# Patient Record
Sex: Male | Born: 1970 | Race: White | Hispanic: No | Smoking: Never smoker
Health system: Southern US, Community
[De-identification: ages and names within clinical notes are randomized; demographics above are authoritative.]

## PROBLEM LIST (undated history)

## (undated) HISTORY — PX: HERNIA REPAIR: SHX51

## (undated) HISTORY — PX: APPENDECTOMY: SHX54

## (undated) HISTORY — PX: VASECTOMY: SHX75

---

## 2000-02-15 ENCOUNTER — Ambulatory Visit (HOSPITAL_BASED_OUTPATIENT_CLINIC_OR_DEPARTMENT_OTHER): Admission: RE | Admit: 2000-02-15 | Discharge: 2000-02-15 | Payer: Self-pay | Admitting: Surgery

## 2008-08-04 ENCOUNTER — Observation Stay (HOSPITAL_COMMUNITY): Admission: EM | Admit: 2008-08-04 | Discharge: 2008-08-05 | Payer: Self-pay | Admitting: Emergency Medicine

## 2008-08-04 ENCOUNTER — Encounter (INDEPENDENT_AMBULATORY_CARE_PROVIDER_SITE_OTHER): Payer: Self-pay | Admitting: General Surgery

## 2009-08-17 ENCOUNTER — Encounter: Admission: RE | Admit: 2009-08-17 | Discharge: 2009-08-17 | Payer: Self-pay | Admitting: General Surgery

## 2009-09-21 ENCOUNTER — Ambulatory Visit (HOSPITAL_BASED_OUTPATIENT_CLINIC_OR_DEPARTMENT_OTHER): Admission: RE | Admit: 2009-09-21 | Discharge: 2009-09-21 | Payer: Self-pay | Admitting: General Surgery

## 2011-02-08 LAB — CBC
Hemoglobin: 14.6 g/dL (ref 13.0–17.0)
MCHC: 35.5 g/dL (ref 30.0–36.0)
RBC: 4.8 MIL/uL (ref 4.22–5.81)
RDW: 13 % (ref 11.5–15.5)

## 2011-02-08 LAB — BASIC METABOLIC PANEL
BUN: 11 mg/dL (ref 6–23)
GFR calc Af Amer: >60 mL/min (ref >60)
GFR calc non Af Amer: >60 mL/min (ref >60)

## 2011-02-08 LAB — DIFFERENTIAL
Basophils Relative: 1 % (ref 0–1)
Eosinophils Relative: 3 % (ref 0–5)
Lymphocytes Relative: 35 % (ref 12–46)
Lymphs Abs: 1.9 10*3/uL (ref 0.7–4.0)
Monocytes Absolute: 0.5 10*3/uL (ref 0.1–1.0)

## 2011-03-21 NOTE — Op Note (Signed)
NAMEJEREMIAS, BROYHILL                  ACCOUNT NO.:  [id number]   MEDICAL RECORD NO.:  [id number]          PATIENT TYPE:  INP   LOCATION:  5127                         FACILITY:  [facility]   PHYSICIAN:  Ollen Gross. Vernell Morgans, M.D. DATE OF BIRTH:  [birth year]   DATE OF PROCEDURE:  08/04/2008  DATE OF DISCHARGE:                               OPERATIVE REPORT   PREOPERATIVE DIAGNOSIS:  Acute appendicitis.   POSTOPERATIVE DIAGNOSIS:  Acute appendicitis.   PROCEDURE:  Laparoscopic appendectomy.   SURGEON:  Ollen Gross. Vernell Morgans, MD   ANESTHESIA:  General endotracheal.   PROCEDURE:  After informed consent was obtained, the patient was brought  to the operating room, placed in a supine position on the operating room  table.  After adequate induction of general anesthesia, the patient's  abdomen was prepped with Betadine and draped in usual sterile manner.  An area below the umbilicus was infiltrated with 0.25% Marcaine.  Small  incision was made with a 15-blade knife.  This incision was carried down  through the subcutaneous tissue bluntly with a hemostat and [employer]  retractors until linea alba was identified.  The linea alba was incised  with 15-blade knife and each side was grasped with Kocher clamps and  elevated anteriorly.  The preperitoneal space was then probed bluntly  with an hemostat until the peritoneum was opened and access was gained  to the abdominal cavity.  A 0 Vicryl purse-string stitch was placed in  the fascia around the opening.  Hasson cannula was placed through the  opening and anchored in place to the previous 0 Vicryl pursestring  stitch.  The abdomen was then insufflated with carbon dioxide without  difficulty.  The patient was placed in Trendelenburg position, rotated  with the right side up.  Next, the suprapubic area was infiltrated with  0.25% Marcaine.  A small incision was made with 15-blade knife and a  10/11 mm port was placed bluntly through this incision into the  abdominal cavity under direct vision.  A site was then chosen in the  right upper quadrant for placement of 5-mm port.  This area was  infiltrated with 0.25% Marcaine.  A small stab incision was made with 15-  blade knife and a 5-mm port was placed bluntly through this incision  into the abdominal cavity under direct vision.  The laparoscope was then  moved to the suprapubic port.  We were also able to use a 30-degree  scope.  Using the [facility] grasper and harmonic scalpel, the right lower  quadrant was inspected.  The appendix was readily identified.  The  appendix was very long, but inflamed.  We were able to elevate the  appendix anteriorly and the mesoappendix was then taken down sharply  with the harmonic scalpel.  Once this was accomplished, the base of the  appendix at its junction with the cecum was identified and cleared of  any tissue.  A laparoscopic 45-mm blue load six row stapler was then  placed through the Hasson cannula across the base of the appendix at its  junction with the  cecum clamped and fired thereby dividing the base of  the appendix between the staple lines.  A laparoscopic bag was inserted  through the Hasson cannula.  The appendix was placed in the bag and the  bag was sealed.  The staple line was then inspected, and there was a  small bleeding area along the staple line and this was controlled with a  clip applier.  Once this was accomplished, the area was completely dry.  The area was then irrigated with copious amounts of saline till the  effluent was clear.  The appendix and bag were removed through the  infraumbilical port with Hasson cannula without difficulty.  The fascial  defect was closed with a previous placed Vicryl purse-string stitch as  well as with another figure-of-eight 0 Vicryl stitch.  The rest of the  ports were removed under direct vision and were found to be hemostatic.  The gas was allowed to escape.  The skin incisions were all closed  with  interrupted 4-0 Monocryl  subcuticular stitches and Dermabond dressings were applied.  The patient  tolerated the procedure well.  At the end of the case, all needle,  sponge, and instrument counts were correct.  The patient was then  awakened and taken to recovery in stable condition.      Ollen Gross. Vernell Morgans, M.D.  Electronically Signed     PST/MEDQ  D:  08/04/2008  T:  08/05/2008  Job:  [id number]

## 2011-03-21 NOTE — H&P (Signed)
NAMEDONIVIN, WIRT                  ACCOUNT NO.:  [id number]   MEDICAL RECORD NO.:  [id number]          PATIENT TYPE:  INP   LOCATION:  5127                         FACILITY:  [facility]   PHYSICIAN:  Ollen Gross. Vernell Morgans, M.D. DATE OF BIRTH:  10/20/1971   DATE OF ADMISSION:  08/04/2008  DATE OF DISCHARGE:                              HISTORY & PHYSICAL   [name] is a 40 year old white male who presents with right lower  quadrant pain that started this morning.  The pain has gotten worse  throughout the day.  The pain has been associated with fever at home as  well as some nausea and vomiting.  He denies any chest pain or shortness  of breath.  No diarrhea or dysuria.   His other review of systems is unremarkable.   His past medical history is none.   Past surgical history is significant for I and D of a pilonidal cyst and  ORIF of the wrist.   Medications are none.   ALLERGIES:  None.   SOCIAL HISTORY:  He denies use of tobacco or tobacco products.   FAMILY HISTORY:  Noncontributory.   PHYSICAL EXAMINATION:  VITAL SIGNS:  His temp is 98.6, pulse 85, blood  pressure 126/75.  GENERAL:  He is a well-developed, well-nourished, white male in no acute  distress.  SKIN:  Warm and dry.  No jaundice.  HEENT:  Eyes, extraocular movements are intact.  Pupils are equal and  reactive to light.  Sclerae are nonicteric.  LUNGS:  Clear bilaterally with no use of accessory respiratory muscles.  HEART:  Regular rate and rhythm with an impulse in the left chest.  ABDOMEN:  Soft with moderate tenderness in the right lower quadrant.  No  peritonitis or guarding.  EXTREMITIES:  No cyanosis, clubbing, or edema.  Good strength in arms  and legs.  PSYCHOLOGIC:  He is alert and oriented x3 with no evidence of anxiety or  depression.   On review of his lab work, it was significant for white count of 15,000.  On review of his CT scan, it did show an enlarged inflamed appendix.   ASSESSMENT AND PLAN:   This is a 40 year old white male with what appears  to be acute appendicitis.  Because of the risks of perforation of the  sepsis, I believe he would benefit from having his appendix removed  tonight.  He  would also like to have this done.  I have explained him in detail the  risk and benefits of the operation to remove the appendix as well as  some of the technical aspects, and he understands and wishes to proceed.  We will obtain some routine lab work in preparation of doing this for  him tonight.      Ollen Gross. Vernell Morgans, M.D.  Electronically Signed     PST/MEDQ  D:  08/04/2008  T:  08/05/2008  Job:  [id number]

## 2011-08-07 LAB — CBC
Hemoglobin: 14.1
MCHC: 34
MCV: 87.3
Platelets: 236
RBC: 4.75
RDW: 13.2
WBC: 15 — ABNORMAL HIGH

## 2011-08-07 LAB — COMPREHENSIVE METABOLIC PANEL
AST: 29
Albumin: 4.1
Alkaline Phosphatase: 77
Calcium: 9.4
GFR calc Af Amer: >60
Glucose, Bld: 109 — ABNORMAL HIGH
Total Protein: 7

## 2011-08-07 LAB — DIFFERENTIAL
Basophils Relative: 0
Eosinophils Relative: 0
Lymphocytes Relative: 5 — ABNORMAL LOW
Lymphs Abs: 0.8
Monocytes Relative: 5
Neutro Abs: 13.4 — ABNORMAL HIGH

## 2011-08-07 LAB — URINALYSIS, ROUTINE W REFLEX MICROSCOPIC
Bilirubin Urine: NEGATIVE
Glucose, UA: NEGATIVE
Hgb urine dipstick: NEGATIVE
Ketones, ur: NEGATIVE
Specific Gravity, Urine: 1.027
Urobilinogen, UA: 0.2
pH: 7.5

## 2012-11-06 HISTORY — PX: CARDIAC CATHETERIZATION: SHX172

## 2012-11-15 ENCOUNTER — Other Ambulatory Visit: Payer: Self-pay | Admitting: Cardiology

## 2012-11-18 ENCOUNTER — Other Ambulatory Visit: Payer: Self-pay | Admitting: Cardiology

## 2012-11-18 ENCOUNTER — Encounter: Payer: Self-pay | Admitting: Cardiology

## 2012-11-18 NOTE — H&P (Signed)
Office Visit     Patient: Jacob Sutton, Jacob Sutton Provider: Silvestre Mines, MD  DOB: 09/11/1971 Age: 41 Y Sex: Male Date: 11/14/2012  Phone: [phone]   Address: [address], [place], [place]  Pcp: TRACY L THOMAS       Subjective:     CC:    1. TT/Stress test f/u.        HPI:  General:  The patient presents today for followup of chest pain. He says that he has been having chest pain intermittently for over a year. He says it is a dull ache midsternal that usually comes on with exertion. He has noticed in the last few months that if he over exerts himself his chest feels tight and the pain is much worse. If he exerts himself the pain will go up into his left shoulder and left neck. He does get SOB with the pain but denies any diaphoresis or nausea. He has a very strong family history of CAD as well. He underwent nuclear stress test which showed mild ischemia in the anterior wall with normal LVF. His EKG was normal and he had no chest pain on the treadmill. He has not had much CP recently because he says that he has not been exerting himself on purpose so he does not get chest pain. He is also concerned that he has much more exertional fatigue than he had in the past..        ROS:  See HPI, A twelve system review was perfomed at today's visit. For pertinent positives and negatives see HPI.       Medical History: Bilateral inguinal hernia repair 2010.        Surgical History: appendectomy per [place] surg 10/09, vasectomy 10/2007, hernia surgery 2010.        Family History: Father: alive CAD diagnosed late 40's, 3 stents, bypass sx Mother: alive CAD @ 60, 1 stent Paternal Grand Mother: CAD Maternal Grand Mother: CAD 1 sister(s) - healthy.        Social History:  General:  History of smoking  cigarettes: Never smoked no Smoking.  no Tobacco Exposure.  Alcohol: yes, occasionally.  Caffeine: yes, tea, coffee.  no Recreational drug use.  Occupation: employed, welding.    Marital Status: married.  Children: Boys, 1, girls, 1.  Seat belt use: yes.        Medications: None       Allergies: N.Sutton.D.A.      Objective:     Vitals: Wt 228, Wt change 1.4 lb, Ht 72, BMI 30.92, Pulse sitting 82, BP sitting 126/86.       Examination:        Assessment:     Assessment:  1. Chest pain - 786.50 (Primary)  2. Abnormal cardiovascular stress test - 794.39    Plan:     1. Chest pain  LAB: Basic Metabolic Normal    GLUCOSE 83 70-99 - mg/dL    BUN 15 6-26 - mg/dL    CREATININE 0.91 0.60-1.30 - mg/dl    eGFR (NON-AFRICAN AMERICAN) 92 >60 - calc    eGFR (AFRICAN AMERICAN) 111 >60 - calc    SODIUM 140 136-145 - mmol/L    POTASSIUM 4.5 3.5-5.5 - mmol/L    CHLORIDE 104 98-107 - mmol/L    C02 29 22-32 - mmol/L    ANION GAP 10.8 6.0-20.0 - mmol/L    CALCIUM 9.8 8.6-10.3 - mg/dL     Corson,Danielle 11/14/2012 02:50:02 PM >For Cath. Sending   to Dr. Tineka Uriegas to [facility]. Alexiah Koroma M 11/14/2012 02:50:53 PM >    LAB: CBC with Diff Normal    WBC 5.3 4.0-11.0 - Sutton/ul    RBC 5.09 4.20-5.80 - M/uL    HGB 14.6 13.0-17.0 - g/dL    HCT 43.2 39.0-52.0 - %    MCH 28.6 27.0-33.0 - pg    MPV 7.9 7.5-10.7 - fL    MCV 84.8 80.0-94.0 - fL    MCHC 33.8 32.0-36.0 - g/dL    RDW 13.2 11.5-15.5 - %    NRBC# 0.00 -    PLT 229 150-400 - Sutton/uL    NEUT % 51.7 43.3-71.9 - %    NRBC% 0.10 - %    LYMPH% 34.8 16.8-43.5 - %    MONO % 10.1 4.6-12.4 - %    EOS % 2.8 0.0-7.8 - %    BASO % 0.6 0.0-1.0 - %    NEUT # 2.8 1.9-7.2 - Sutton/uL    LYMPH# 1.90 1.10-2.70 - Sutton/uL    MONO # 0.5 0.3-0.8 - Sutton/uL    EOS # 0.1 0.0-0.6 - Sutton/uL    BASO # 0.0 0.0-0.1 - Sutton/uL     Alfio Loescher M 11/14/2012 02:47:56 PM > Corson,Danielle 11/14/2012 02:50:45 PM > For Cath    LAB: PT (Prothrombin Time) (005199) Abnormal    Prothrombin Time 10.6 9.1-12.0 - SEC    INR 1.0 0.8-1.2 -     Keddrick Wyne M 11/15/2012 09:12:27 AM > Corson,Danielle 11/15/2012 11:35:48 AM > FOR CATH    I have reviewed the findings  of the stress test with him. I am concerned given his symptoms and family history as well as abnormal nuclear stress test that he has underlying CAD and have reocmmended proceeding cardiac cath. , Risks and benefits of cardiac catheterization have been reviewed including risk of stroke, heart attack, death, bleeding, renal impariment and arterial damage. There was ample oppurtuny to answer questions. Alternatives were discussed. Patient understands and wishes to proceed.        Procedures:  Venipuncture:  Venipuncture: Smith,Michele 11/14/2012 04:02:12 PM > , performed in right arm.        Immunizations:        Labs:        Procedure Codes: 80048 ECL BMP, 85025 ECL CBC PLATELET DIFF, 36415 BLOOD COLLECTION ROUTINE VENIPUNCTURE       Preventive:         Follow Up: cath      Provider: Jamol Ginyard, MD  Patient: Jacob Sutton, Jacob Sutton DOB: 12/22/1970 Date: 11/14/2012    

## 2012-11-19 ENCOUNTER — Encounter (HOSPITAL_BASED_OUTPATIENT_CLINIC_OR_DEPARTMENT_OTHER)
Admission: RE | Disposition: A | Discharge status: Home / Self Care | Payer: Self-pay | Source: Ambulatory Visit | Attending: Cardiology

## 2012-11-19 ENCOUNTER — Inpatient Hospital Stay (HOSPITAL_BASED_OUTPATIENT_CLINIC_OR_DEPARTMENT_OTHER)
Admission: RE | Admit: 2012-11-19 | Discharge: 2012-11-19 | Disposition: A | Discharge status: Home / Self Care | Payer: BC Managed Care – PPO | Source: Ambulatory Visit | Attending: Cardiology | Admitting: Cardiology

## 2012-11-19 DIAGNOSIS — R0789 Other chest pain: Secondary | ICD-10-CM | POA: Insufficient documentation

## 2012-11-19 DIAGNOSIS — R0602 Shortness of breath: Secondary | ICD-10-CM

## 2012-11-19 DIAGNOSIS — R9439 Abnormal result of other cardiovascular function study: Secondary | ICD-10-CM

## 2012-11-19 DIAGNOSIS — R079 Chest pain, unspecified: Secondary | ICD-10-CM

## 2012-11-19 SURGERY — JV LEFT HEART CATHETERIZATION WITH CORONARY ANGIOGRAM
Anesthesia: Moderate Sedation

## 2012-11-19 MED ORDER — SODIUM CHLORIDE 0.9 % IJ SOLN: 3.0000 mL | Freq: Two times a day (BID) | INTRAMUSCULAR | Status: DC

## 2012-11-19 MED ORDER — ONDANSETRON HCL 4 MG/2ML IJ SOLN: 4.0000 mg | Freq: Four times a day (QID) | INTRAMUSCULAR | Status: DC | PRN

## 2012-11-19 MED ORDER — ACETAMINOPHEN 325 MG PO TABS: 650.0000 mg | ORAL_TABLET | ORAL | Status: DC | PRN

## 2012-11-19 MED ORDER — SODIUM CHLORIDE 0.9 % IV SOLN
INTRAVENOUS | Status: DC
  Administered 2012-11-19: 09:00:00 via INTRAVENOUS

## 2012-11-19 MED ORDER — DIAZEPAM 5 MG PO TABS
5.0000 mg | ORAL_TABLET | ORAL | Status: AC
  Administered 2012-11-19: 5 mg via ORAL

## 2012-11-19 MED ORDER — ASPIRIN 81 MG PO CHEW
324.0000 mg | CHEWABLE_TABLET | ORAL | Status: AC
  Administered 2012-11-19: 324 mg via ORAL

## 2012-11-19 MED ORDER — SODIUM CHLORIDE 0.9 % IJ SOLN: 3.0000 mL | INTRAMUSCULAR | Status: DC | PRN

## 2012-11-19 MED ORDER — SODIUM CHLORIDE 0.9 % IV SOLN: 250.0000 mL | INTRAVENOUS | Status: DC | PRN

## 2012-11-19 MED ORDER — SODIUM CHLORIDE 0.9 % IV SOLN: 1.0000 mL/kg/h | INTRAVENOUS | Status: DC

## 2012-11-19 NOTE — H&P (View-Only) (Signed)
Office Visit     Patient: Filter, Isiaah K Provider: Wilberto Console, MD  DOB: 01/25/1971 Age: 42 Y Sex: Male Date: 11/14/2012  Phone: [phone]   Address: [address], [place], [place]  Pcp: TRACY L THOMAS       Subjective:     CC:    1. TT/Stress test f/u.        HPI:  General:  The patient presents today for followup of chest pain. He says that he has been having chest pain intermittently for over a year. He says it is a dull ache midsternal that usually comes on with exertion. He has noticed in the last few months that if he over exerts himself his chest feels tight and the pain is much worse. If he exerts himself the pain will go up into his left shoulder and left neck. He does get SOB with the pain but denies any diaphoresis or nausea. He has a very strong family history of CAD as well. He underwent nuclear stress test which showed mild ischemia in the anterior wall with normal LVF. His EKG was normal and he had no chest pain on the treadmill. He has not had much CP recently because he says that he has not been exerting himself on purpose so he does not get chest pain. He is also concerned that he has much more exertional fatigue than he had in the past..        ROS:  See HPI, A twelve system review was perfomed at today's visit. For pertinent positives and negatives see HPI.       Medical History: Bilateral inguinal hernia repair 2010.        Surgical History: appendectomy per [place] surg 10/09, vasectomy 10/2007, hernia surgery 2010.        Family History: Father: alive CAD diagnosed late 40's, 3 stents, bypass sx Mother: alive CAD @ 60, 1 stent Paternal Grand Mother: CAD Maternal Grand Mother: CAD 1 sister(s) - healthy.        Social History:  General:  History of smoking  cigarettes: Never smoked no Smoking.  no Tobacco Exposure.  Alcohol: yes, occasionally.  Caffeine: yes, tea, coffee.  no Recreational drug use.  Occupation: employed, welding.    Marital Status: married.  Children: Boys, 1, girls, 1.  Seat belt use: yes.        Medications: None       Allergies: N.K.D.A.      Objective:     Vitals: Wt 228, Wt change 1.4 lb, Ht 72, BMI 30.92, Pulse sitting 82, BP sitting 126/86.       Examination:        Assessment:     Assessment:  1. Chest pain - 786.50 (Primary)  2. Abnormal cardiovascular stress test - 794.39    Plan:     1. Chest pain  LAB: Basic Metabolic Normal    GLUCOSE 83 70-99 - mg/dL    BUN 15 6-26 - mg/dL    CREATININE 0.91 0.60-1.30 - mg/dl    eGFR (NON-AFRICAN AMERICAN) 92 >60 - calc    eGFR (AFRICAN AMERICAN) 111 >60 - calc    SODIUM 140 136-145 - mmol/L    POTASSIUM 4.5 3.5-5.5 - mmol/L    CHLORIDE 104 98-107 - mmol/L    C02 29 22-32 - mmol/L    ANION GAP 10.8 6.0-20.0 - mmol/L    CALCIUM 9.8 8.6-10.3 - mg/dL     Corson,Danielle 11/14/2012 02:50:02 PM >For Cath. Sending   to Dr. Ulani Degrasse to [facility]. Nikos Anglemyer M 11/14/2012 02:50:53 PM >    LAB: CBC with Diff Normal    WBC 5.3 4.0-11.0 - K/ul    RBC 5.09 4.20-5.80 - M/uL    HGB 14.6 13.0-17.0 - g/dL    HCT 43.2 39.0-52.0 - %    MCH 28.6 27.0-33.0 - pg    MPV 7.9 7.5-10.7 - fL    MCV 84.8 80.0-94.0 - fL    MCHC 33.8 32.0-36.0 - g/dL    RDW 13.2 11.5-15.5 - %    NRBC# 0.00 -    PLT 229 150-400 - K/uL    NEUT % 51.7 43.3-71.9 - %    NRBC% 0.10 - %    LYMPH% 34.8 16.8-43.5 - %    MONO % 10.1 4.6-12.4 - %    EOS % 2.8 0.0-7.8 - %    BASO % 0.6 0.0-1.0 - %    NEUT # 2.8 1.9-7.2 - K/uL    LYMPH# 1.90 1.10-2.70 - K/uL    MONO # 0.5 0.3-0.8 - K/uL    EOS # 0.1 0.0-0.6 - K/uL    BASO # 0.0 0.0-0.1 - K/uL     Vanecia Limpert M 11/14/2012 02:47:56 PM > Corson,Danielle 11/14/2012 02:50:45 PM > For Cath    LAB: PT (Prothrombin Time) (005199) Abnormal    Prothrombin Time 10.6 9.1-12.0 - SEC    INR 1.0 0.8-1.2 -     Kennette Cuthrell M 11/15/2012 09:12:27 AM > Corson,Danielle 11/15/2012 11:35:48 AM > FOR CATH    I have reviewed the findings  of the stress test with him. I am concerned given his symptoms and family history as well as abnormal nuclear stress test that he has underlying CAD and have reocmmended proceeding cardiac cath. , Risks and benefits of cardiac catheterization have been reviewed including risk of stroke, heart attack, death, bleeding, renal impariment and arterial damage. There was ample oppurtuny to answer questions. Alternatives were discussed. Patient understands and wishes to proceed.        Procedures:  Venipuncture:  Venipuncture: Smith,Michele 11/14/2012 04:02:12 PM > , performed in right arm.        Immunizations:        Labs:        Procedure Codes: 80048 ECL BMP, 85025 ECL CBC PLATELET DIFF, 36415 BLOOD COLLECTION ROUTINE VENIPUNCTURE       Preventive:         Follow Up: cath      Provider: Genelda Roark, MD  Patient: Aldous, Jhalil K DOB: 01/21/1971 Date: 11/14/2012    

## 2012-11-19 NOTE — CV Procedure (Signed)
PROCEDURE:  Left heart catheterization with selective coronary angiography, left ventriculogram.  INDICATIONS:  Chest pain and abnormal nuclear stress test  The risks, benefits, and details of the procedure were explained to the patient.  The patient verbalized understanding and wanted to proceed.  Informed written consent was obtained.  PROCEDURE TECHNIQUE:  After Xylocaine anesthesia a 20F sheath was placed in the right femoral artery with a single anterior needle wall stick.   Left coronary angiography was done using a Judkins L4 guide catheter.  Right coronary angiography was done using a Judkins R4 guide catheter.  Left ventriculography was done using a pigtail catheter.    CONTRAST:  Total of 65 cc.  COMPLICATIONS:  None.    HEMODYNAMICS:  Aortic pressure was 112/9mmHg; LV pressure was 110/30mmhg; LVEDP .  There was no gradient between the left ventricle and aorta.    ANGIOGRAPHIC DATA:   The left main coronary artery is widely patent and bifurcates into a LAD and left circumflex arteries.  The left anterior descending artery is widely patent and gives rise to a high  first diagonal which is widely patent.  It then gives rise to a second and third diagonal branches which are patent.  The ongoing LAD is patent and traverses to the apex.  The left circumflex artery is widely patent. It gives rise to a small first OM which is patent.  It then gives rise to a moderate sized OM2 which is patent.  The left circ traverses the AV groove and is widely patent, distally gives rise to a 3rd OM which is widely patent.    The right coronary artery is widely patent.  It gives rise to a moderate sized acute RV marginal branch which is patent.  Distally it bifurcates into a PDA and PL which are patent.    LEFT VENTRICULOGRAM:  Left ventricular angiogram was done in the 30 RAO projection and revealed normal left ventricular wall motion and systolic function with an estimated ejection fraction of  55%.  LVEDP was 12 mmHg.  IMPRESSIONS:  1. Normal left main coronary artery. 2. Normal left anterior descending artery and its branches. 3. Normal left circumflex artery and its branches. 4. Normal right coronary artery. 5. Normal left ventricular systolic function.  LVEDP 12 mmHg.  Ejection fraction 55%.  RECOMMENDATION:   1.  D/C home after IVF and bedrest complete. 2.  Workup of noncardiac chest pain and SOB per primary MD 3.  Followup in my office in 2 weeks for groin check

## 2012-11-19 NOTE — Interval H&P Note (Signed)
History and Physical Interval Note:  11/19/2012 9:36 AM  Jacob Sutton  has presented today for surgery, with the diagnosis of cp  The various methods of treatment have been discussed with the patient and family. After consideration of risks, benefits and other options for treatment, the patient has consented to  Procedure(s) (LRB) with comments: JV LEFT HEART CATHETERIZATION WITH CORONARY ANGIOGRAM (N/A) as a surgical intervention .  The patient's history has been reviewed, patient examined, no change in status, stable for surgery.  I have reviewed the patient's chart and labs.  Questions were answered to the patient's satisfaction.     Alverda Nazzaro R

## 2012-11-19 NOTE — OR Nursing (Signed)
Tegaderm dressing applied, site level 0, bedrest begins at 1020 

## 2013-02-26 ENCOUNTER — Ambulatory Visit (INDEPENDENT_AMBULATORY_CARE_PROVIDER_SITE_OTHER): Payer: BC Managed Care – PPO | Admitting: Sports Medicine

## 2013-02-26 ENCOUNTER — Emergency Department (INDEPENDENT_AMBULATORY_CARE_PROVIDER_SITE_OTHER): Payer: BC Managed Care – PPO

## 2013-02-26 ENCOUNTER — Emergency Department
Admission: EM | Admit: 2013-02-26 | Discharge: 2013-02-26 | Disposition: A | Discharge status: Home / Self Care | Payer: BC Managed Care – PPO | Source: Home / Self Care | Attending: Family Medicine | Admitting: Family Medicine

## 2013-02-26 DIAGNOSIS — S62309A Unspecified fracture of unspecified metacarpal bone, initial encounter for closed fracture: Secondary | ICD-10-CM

## 2013-02-26 DIAGNOSIS — W19XXXA Unspecified fall, initial encounter: Secondary | ICD-10-CM

## 2013-02-26 DIAGNOSIS — S62306A Unspecified fracture of fifth metacarpal bone, right hand, initial encounter for closed fracture: Secondary | ICD-10-CM | POA: Insufficient documentation

## 2013-02-26 DIAGNOSIS — S62308A Unspecified fracture of other metacarpal bone, initial encounter for closed fracture: Secondary | ICD-10-CM

## 2013-02-26 DIAGNOSIS — S62339A Displaced fracture of neck of unspecified metacarpal bone, initial encounter for closed fracture: Secondary | ICD-10-CM

## 2013-02-26 MED ORDER — PERMETHRIN 5 % EX CREA: TOPICAL_CREAM | Freq: Once | CUTANEOUS | Status: DC

## 2013-02-26 MED ORDER — HYDROCODONE-ACETAMINOPHEN 5-325 MG PO TABS: 1.0000 | ORAL_TABLET | Freq: Three times a day (TID) | ORAL | Status: DC | PRN
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
## 2013-02-27 ENCOUNTER — Institutional Professional Consult (permissible substitution): Payer: BC Managed Care – PPO | Admitting: Sports Medicine

## 2013-03-05 ENCOUNTER — Ambulatory Visit (INDEPENDENT_AMBULATORY_CARE_PROVIDER_SITE_OTHER): Payer: BC Managed Care – PPO | Admitting: Sports Medicine

## 2013-03-05 ENCOUNTER — Other Ambulatory Visit: Payer: Self-pay | Admitting: Sports Medicine

## 2013-03-05 ENCOUNTER — Ambulatory Visit: Payer: Self-pay

## 2013-03-05 ENCOUNTER — Encounter: Payer: Self-pay | Admitting: Sports Medicine

## 2013-03-05 ENCOUNTER — Ambulatory Visit (INDEPENDENT_AMBULATORY_CARE_PROVIDER_SITE_OTHER): Payer: BC Managed Care – PPO

## 2013-03-05 VITALS — BP 123/88 | HR 81

## 2013-03-05 DIAGNOSIS — T148XXA Other injury of unspecified body region, initial encounter: Secondary | ICD-10-CM

## 2013-03-05 DIAGNOSIS — S62309A Unspecified fracture of unspecified metacarpal bone, initial encounter for closed fracture: Secondary | ICD-10-CM

## 2013-03-05 DIAGNOSIS — IMO0001 Reserved for inherently not codable concepts without codable children: Secondary | ICD-10-CM

## 2013-03-05 DIAGNOSIS — S62306A Unspecified fracture of fifth metacarpal bone, right hand, initial encounter for closed fracture: Secondary | ICD-10-CM

## 2013-03-05 NOTE — Assessment & Plan Note (Signed)
Doing well 2 weeks status post nondisplaced, minimally angulated fracture of the neck of the fifth metacarpal bone of the right hand. He still has some swelling something to keep him in a splint. No further pain management needed. Return in 2 weeks, x-ray before visit.
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
## 2013-03-19 ENCOUNTER — Ambulatory Visit (INDEPENDENT_AMBULATORY_CARE_PROVIDER_SITE_OTHER): Payer: BC Managed Care – PPO | Admitting: Sports Medicine

## 2013-03-19 ENCOUNTER — Encounter: Payer: Self-pay | Admitting: Sports Medicine

## 2013-03-19 ENCOUNTER — Ambulatory Visit (INDEPENDENT_AMBULATORY_CARE_PROVIDER_SITE_OTHER): Payer: BC Managed Care – PPO

## 2013-03-19 VITALS — BP 117/74 | HR 74 | Wt 207.0 lb

## 2013-03-19 DIAGNOSIS — Z09 Encounter for follow-up examination after completed treatment for conditions other than malignant neoplasm: Secondary | ICD-10-CM

## 2013-03-19 DIAGNOSIS — Z0289 Encounter for other administrative examinations: Secondary | ICD-10-CM

## 2013-03-19 DIAGNOSIS — S62309A Unspecified fracture of unspecified metacarpal bone, initial encounter for closed fracture: Secondary | ICD-10-CM

## 2013-03-19 DIAGNOSIS — S62306A Unspecified fracture of fifth metacarpal bone, right hand, initial encounter for closed fracture: Secondary | ICD-10-CM

## 2013-03-19 NOTE — Assessment & Plan Note (Addendum)
Continue splint, return in 3 weeks.  Disability form and charge sheet filled out

## 2013-03-19 NOTE — Progress Notes (Signed)
  Subjective: 3 and half weeks status post right fifth metacarpal neck fracture. Doing well in ulnar gutter splint. Pain is minimal.   Objective: General: Well-developed, well-nourished, and in no acute distress. Splint is removed, there is only minimal swelling, he is quite tender over the fracture site. Neurovascularly intact distally.  X-rays reviewed, there is no change in position or alignment, he does have copious bony callus formation.  Assessment/plan:

## 2013-04-04 ENCOUNTER — Encounter: Payer: Self-pay | Admitting: Sports Medicine

## 2013-04-04 ENCOUNTER — Ambulatory Visit (INDEPENDENT_AMBULATORY_CARE_PROVIDER_SITE_OTHER): Payer: BC Managed Care – PPO | Admitting: Sports Medicine

## 2013-04-04 VITALS — BP 104/73 | HR 67 | Wt 210.0 lb

## 2013-04-04 DIAGNOSIS — Z0000 Encounter for general adult medical examination without abnormal findings: Secondary | ICD-10-CM | POA: Insufficient documentation

## 2013-04-04 DIAGNOSIS — S62306D Unspecified fracture of fifth metacarpal bone, right hand, subsequent encounter for fracture with routine healing: Secondary | ICD-10-CM

## 2013-04-04 DIAGNOSIS — Z299 Encounter for prophylactic measures, unspecified: Secondary | ICD-10-CM

## 2013-04-04 NOTE — Progress Notes (Signed)
  Subjective: 6-1/2 weeks out from right fifth metacarpal fracture, healed, no pain.   Objective: General: Well-developed, well-nourished, and in no acute distress. Hand is unremarkable, no pain over the fracture site, excellent motion, excellent strength.  Assessment/plan:

## 2013-04-04 NOTE — Assessment & Plan Note (Signed)
He will come back for a complete physical. I am going to go ahead and order some routine blood work.

## 2013-04-04 NOTE — Assessment & Plan Note (Signed)
Healed

## 2013-04-08 LAB — CBC
HCT: 41.1 % (ref 39.0–52.0)
Hemoglobin: 14.4 g/dL (ref 13.0–17.0)
MCH: 29 pg (ref 26.0–34.0)
MCHC: 35 g/dL (ref 30.0–36.0)
MCV: 82.7 fL (ref 78.0–100.0)
Platelets: 206 K/uL (ref 150–400)
RBC: 4.97 MIL/uL (ref 4.22–5.81)
RDW: 14.6 % (ref 11.5–15.5)
WBC: 5.3 K/uL (ref 4.0–10.5)

## 2013-04-08 LAB — HEMOGLOBIN A1C
Hgb A1c MFr Bld: 5.3 % (ref <5.7)
Mean Plasma Glucose: 105 mg/dL (ref <117)

## 2013-04-09 LAB — COMPREHENSIVE METABOLIC PANEL
BUN: 14 mg/dL (ref 6–23)
CO2: 25 mEq/L (ref 19–32)
Creat: 0.94 mg/dL (ref 0.50–1.35)
Glucose, Bld: 81 mg/dL (ref 70–99)
Sodium: 140 mEq/L (ref 135–145)
Total Bilirubin: 0.6 mg/dL (ref 0.3–1.2)
Total Protein: 6.7 g/dL (ref 6.0–8.3)

## 2013-04-09 LAB — TESTOSTERONE, FREE, TOTAL, SHBG
Sex Hormone Binding: 26 nmol/L (ref 13–71)
Testosterone, Free: 77.1 pg/mL (ref 47.0–244.0)
Testosterone-% Free: 2.3 % (ref 1.6–2.9)
Testosterone: 339 ng/dL (ref 300–890)

## 2013-04-09 LAB — TSH: TSH: 1.81 u[IU]/mL (ref 0.350–4.500)

## 2013-04-09 LAB — VITAMIN D 25 HYDROXY (VIT D DEFICIENCY, FRACTURES): Vit D, 25-Hydroxy: 50 ng/mL (ref 30–89)

## 2013-04-09 LAB — LIPID PANEL
Cholesterol: 173 mg/dL (ref 0–200)
HDL: 43 mg/dL (ref >39)
LDL Cholesterol: 112 mg/dL — ABNORMAL HIGH (ref 0–99)
Total CHOL/HDL Ratio: 4 ratio
Triglycerides: 90 mg/dL (ref <150)
VLDL: 18 mg/dL (ref 0–40)

## 2013-04-09 LAB — COMPREHENSIVE METABOLIC PANEL WITH GFR
ALT: 18 U/L (ref 0–53)
AST: 19 U/L (ref 0–37)
Albumin: 4 g/dL (ref 3.5–5.2)
Alkaline Phosphatase: 76 U/L (ref 39–117)
Calcium: 9.5 mg/dL (ref 8.4–10.5)
Chloride: 104 meq/L (ref 96–112)
Potassium: 4.1 meq/L (ref 3.5–5.3)

## 2013-04-14 ENCOUNTER — Encounter: Payer: Self-pay | Admitting: Sports Medicine

## 2013-04-14 ENCOUNTER — Ambulatory Visit (INDEPENDENT_AMBULATORY_CARE_PROVIDER_SITE_OTHER): Payer: BC Managed Care – PPO | Admitting: Sports Medicine

## 2013-04-14 VITALS — BP 104/68 | HR 62 | Wt 207.0 lb

## 2013-04-14 DIAGNOSIS — Z299 Encounter for prophylactic measures, unspecified: Secondary | ICD-10-CM

## 2013-04-14 DIAGNOSIS — H6120 Impacted cerumen, unspecified ear: Secondary | ICD-10-CM

## 2013-04-14 DIAGNOSIS — Z0000 Encounter for general adult medical examination without abnormal findings: Secondary | ICD-10-CM

## 2013-04-14 DIAGNOSIS — H6123 Impacted cerumen, bilateral: Secondary | ICD-10-CM

## 2013-04-14 NOTE — Assessment & Plan Note (Signed)
With mild hearing loss. This resolved after I curetted both sides.

## 2013-04-14 NOTE — Progress Notes (Signed)
  Subjective:    CC: CPE  HPI:  This pleasant 42 year old male really has no complaints. His only concern is difficulty achieving long lasting erections during intercourse, during masturbation he is however able to maintain a full erection.  His labs came back fairly perfect.  He did have a cardiac catheterization in January of this year that was clean.  Past medical history, Surgical history, Family history not pertinant except as noted below, Social history, Allergies, and medications have been entered into the medical record, reviewed, and no changes needed.   Review of Systems: No headache, visual changes, nausea, vomiting, diarrhea, constipation, dizziness, abdominal pain, skin rash, fevers, chills, night sweats, swollen lymph nodes, weight loss, chest pain, body aches, joint swelling, muscle aches, shortness of breath, mood changes, visual or auditory hallucinations.  Objective:    General: Well Developed, well nourished, and in no acute distress.  Neuro: Alert and oriented x3, extra-ocular muscles intact, sensation grossly intact.  HEENT: Normocephalic, atraumatic, pupils equal round reactive to light, neck supple, no masses, no lymphadenopathy, thyroid nonpalpable. Oropharynx, nasopharynx unremarkable, there is bilateral cerumen impaction, it was removed by me with a curette.  Skin: Warm and dry, no rashes noted.  Cardiac: Regular rate and rhythm, no murmurs rubs or gallops.  Respiratory: Clear to auscultation bilaterally. Not using accessory muscles, speaking in full sentences.  Abdominal: Soft, nontender, nondistended, positive bowel sounds, no masses, no organomegaly.  Musculoskeletal: Shoulder, elbow, wrist, hip, knee, ankle stable, and with full range of motion.  Impression and Recommendations:    The patient was counselled, risk factors were discussed, anticipatory guidance given.

## 2013-04-14 NOTE — Assessment & Plan Note (Signed)
Complete physical performed today. Return in one year.

## 2013-09-11 ENCOUNTER — Other Ambulatory Visit: Payer: Self-pay

## 2013-11-18 ENCOUNTER — Encounter: Payer: Self-pay | Admitting: Emergency Medicine

## 2013-11-18 ENCOUNTER — Emergency Department
Admission: EM | Admit: 2013-11-18 | Discharge: 2013-11-18 | Disposition: A | Discharge status: Home / Self Care | Payer: BC Managed Care – PPO | Source: Home / Self Care | Attending: Emergency Medicine | Admitting: Emergency Medicine

## 2013-11-18 DIAGNOSIS — J209 Acute bronchitis, unspecified: Secondary | ICD-10-CM

## 2013-11-18 MED ORDER — AZITHROMYCIN 250 MG PO TABS
ORAL_TABLET | ORAL | Status: DC
Start: 2013-11-18 — End: 2014-04-17

## 2013-11-18 MED ORDER — PROMETHAZINE-CODEINE 6.25-10 MG/5ML PO SYRP: ORAL_SOLUTION | ORAL | Status: DC

## 2013-11-18 NOTE — ED Notes (Signed)
Brain c/o cough, congestion and HA x 5 days. He had body aches and fever the first 2 days. No flu vac this season.

## 2013-11-18 NOTE — ED Provider Notes (Signed)
CSN: [id number]     Arrival date & time 11/18/13  0808 History   First MD Initiated Contact with Patient 11/18/13 0830     Chief Complaint  Patient presents with  . Cough  . Nasal Congestion   (Consider location/radiation/quality/duration/timing/severity/associated sxs/prior Treatment) HPI URI HISTORY  Jacob Sutton is a 43 y.o. male who complains of onset of cold symptoms for several days.  Have been using over-the-counter treatment which helps a little bit.  No chills/sweats +  Fever  +  Nasal congestion +  Discolored Post-nasal drainage No sinus pain/pressure No sore throat  +  Severe, hacking cough, especially at night. Had some wheezing a few days ago but that resolved Mild chest congestion No hemoptysis No shortness of breath No pleuritic pain  No itchy/red eyes No earache  No nausea No vomiting No abdominal pain No diarrhea  No skin rashes +  Fatigue No myalgias No headache   Reviewed past medical history of negative workup for asthma and negative cardiac/catheterization workup last year.  History reviewed. No pertinent past medical history. Past Surgical History  Procedure Laterality Date  . Appendectomy    . Vasectomy    . Hernia repair    . Cardiac catheterization  11/06/12    Clean.   Family History  Problem Relation Age of Onset  . Heart disease Mother   . Heart disease Father   . Diabetes Father    History  Substance Use Topics  . Smoking status: Never Smoker   . Smokeless tobacco: Never Used  . Alcohol Use: Yes    Review of Systems  All other systems reviewed and are negative.    Allergies  Review of patient's allergies indicates no known allergies.  Home Medications   Current Outpatient Rx  Name  Route  Sig  Dispense  Refill  . azithromycin (ZITHROMAX Z-PAK) 250 MG tablet      Take 2 tablets on day one, then 1 tablet daily on days 2 through 5   1 each   0   . promethazine-codeine (PHENERGAN WITH CODEINE) 6.25-10 MG/5ML syrup       Take 1-2 teaspoons every 4-6 hours as needed for cough. May cause drowsiness.   120 mL   0    BP 124/79  Pulse 83  Temp(Src) 98.5 F (36.9 C) (Oral)  Resp 14  Wt 197 lb (89.359 kg)  SpO2 99% Physical Exam  Nursing note and vitals reviewed. Constitutional: He is oriented to person, place, and time. He appears well-developed and well-nourished. No distress.  HENT:  Head: Normocephalic and atraumatic.  Right Ear: Tympanic membrane normal.  Left Ear: Tympanic membrane normal.  Nose: Nose normal.  Mouth/Throat: Oropharynx is clear and moist. No oropharyngeal exudate.  Eyes: Right eye exhibits no discharge. Left eye exhibits no discharge. No scleral icterus.  Neck: Neck supple.  Cardiovascular: Normal rate, regular rhythm and normal heart sounds.   Pulmonary/Chest: No respiratory distress. He has no wheezes. He has rhonchi. He has no rales.  Lymphadenopathy:    He has no cervical adenopathy.  Neurological: He is alert and oriented to person, place, and time.  Skin: Skin is warm and dry.    ED Course  Procedures (including critical care time) Labs Review Labs Reviewed - No data to display Imaging Review No results found.  EKG Interpretation    Date/Time:    Ventricular Rate:    PR Interval:    QRS Duration:   QT Interval:    QTC Calculation:  R Axis:     Text Interpretation:              MDM   1. Acute bronchitis    Treatment options discussed, as well as risks, benefits, alternatives. Patient voiced understanding and agreement with the following plans:  Zithromax Z-Pak Phenergan With Codeine, as needed for severe cough We discussed other treatment options, such as IM Depo-Medrol or by mouth prednisone burst for the inflammatory cough, but he declined those options at this time. Other otc symptomatic care discussed. Follow-up with your primary care doctor in 5-7 days if not improving, or sooner if symptoms become worse. Precautions discussed. Red  flags discussed. Questions invited and answered. Patient voiced understanding and agreement.  Also, I advised he get a flu shot after this acute bronchitis has resolved.     Jacqulyn Cane, MD 11/18/13 [phone]

## 2014-04-17 ENCOUNTER — Encounter: Payer: Self-pay | Admitting: Sports Medicine

## 2014-04-17 ENCOUNTER — Ambulatory Visit (INDEPENDENT_AMBULATORY_CARE_PROVIDER_SITE_OTHER): Payer: BC Managed Care – PPO | Admitting: Sports Medicine

## 2014-04-17 DIAGNOSIS — Z0000 Encounter for general adult medical examination without abnormal findings: Secondary | ICD-10-CM

## 2014-04-17 DIAGNOSIS — Z299 Encounter for prophylactic measures, unspecified: Secondary | ICD-10-CM

## 2014-04-17 NOTE — Assessment & Plan Note (Signed)
Healthy male, complete physical performed. Checking routine blood work. Return in a year.

## 2014-04-17 NOTE — Progress Notes (Signed)
  Subjective:    CC: Complete physical exam  HPI:  This is a very pleasant 43 year-old male, presents for complete physical, he is healthy, and has absolutely no complaints.  Past medical history, Surgical history, Family history not pertinant except as noted below, Social history, Allergies, and medications have been entered into the medical record, reviewed, and no changes needed.   Review of Systems: No headache, visual changes, nausea, vomiting, diarrhea, constipation, dizziness, abdominal pain, skin rash, fevers, chills, night sweats, swollen lymph nodes, weight loss, chest pain, body aches, joint swelling, muscle aches, shortness of breath, mood changes, visual or auditory hallucinations.  Objective:    General: Well Developed, well nourished, and in no acute distress.  Neuro: Alert and oriented x3, extra-ocular muscles intact, sensation grossly intact.  HEENT: Normocephalic, atraumatic, pupils equal round reactive to light, neck supple, no masses, no lymphadenopathy, thyroid nonpalpable. Oropharynx, nasopharynx, external ear canals are unremarkable. Skin: Warm and dry, no rashes noted.  Cardiac: Regular rate and rhythm, no murmurs rubs or gallops.  Respiratory: Clear to auscultation bilaterally. Not using accessory muscles, speaking in full sentences.  Abdominal: Soft, nontender, nondistended, positive bowel sounds, no masses, no organomegaly.  Musculoskeletal: Shoulder, elbow, wrist, hip, knee, ankle stable, and with full range of motion.  Impression and Recommendations:    The patient was counselled, risk factors were discussed, anticipatory guidance given.

## 2014-04-21 LAB — COMPREHENSIVE METABOLIC PANEL
ALT: 12 U/L (ref 0–53)
Alkaline Phosphatase: 64 U/L (ref 39–117)
CO2: 25 mEq/L (ref 19–32)
Sodium: 138 mEq/L (ref 135–145)
Total Bilirubin: 0.5 mg/dL (ref 0.2–1.2)
Total Protein: 7 g/dL (ref 6.0–8.3)

## 2014-04-21 LAB — COMPREHENSIVE METABOLIC PANEL WITH GFR
AST: 16 U/L (ref 0–37)
Albumin: 4.4 g/dL (ref 3.5–5.2)
BUN: 19 mg/dL (ref 6–23)
Calcium: 9.4 mg/dL (ref 8.4–10.5)
Chloride: 103 meq/L (ref 96–112)
Creat: 0.89 mg/dL (ref 0.50–1.35)
Glucose, Bld: 88 mg/dL (ref 70–99)
Potassium: 4.5 meq/L (ref 3.5–5.3)

## 2014-04-21 LAB — CBC
HCT: 42.7 % (ref 39.0–52.0)
Hemoglobin: 14.7 g/dL (ref 13.0–17.0)
MCH: 29.1 pg (ref 26.0–34.0)
MCHC: 34.4 g/dL (ref 30.0–36.0)
MCV: 84.4 fL (ref 78.0–100.0)
Platelets: 221 10*3/uL (ref 150–400)
RBC: 5.06 MIL/uL (ref 4.22–5.81)
RDW: 14.7 % (ref 11.5–15.5)
WBC: 5 K/uL (ref 4.0–10.5)

## 2014-04-21 LAB — TSH: TSH: 2.049 u[IU]/mL (ref 0.350–4.500)

## 2014-04-21 LAB — LIPID PANEL
Cholesterol: 193 mg/dL (ref 0–200)
HDL: 48 mg/dL (ref >39)
LDL Cholesterol: 134 mg/dL — ABNORMAL HIGH (ref 0–99)
Total CHOL/HDL Ratio: 4 Ratio
Triglycerides: 56 mg/dL (ref <150)
VLDL: 11 mg/dL (ref 0–40)

## 2014-04-21 LAB — HEMOGLOBIN A1C
Hgb A1c MFr Bld: 5.7 % — ABNORMAL HIGH (ref <5.7)
Mean Plasma Glucose: 117 mg/dL — ABNORMAL HIGH (ref <117)

## 2014-07-27 ENCOUNTER — Ambulatory Visit (INDEPENDENT_AMBULATORY_CARE_PROVIDER_SITE_OTHER): Payer: BC Managed Care – PPO | Admitting: Sports Medicine

## 2014-07-27 ENCOUNTER — Encounter: Payer: Self-pay | Admitting: Sports Medicine

## 2014-07-27 ENCOUNTER — Telehealth: Payer: Self-pay

## 2014-07-27 VITALS — BP 126/81 | HR 60 | Ht 72.0 in | Wt 203.0 lb

## 2014-07-27 DIAGNOSIS — R1032 Left lower quadrant pain: Secondary | ICD-10-CM | POA: Insufficient documentation

## 2014-07-27 DIAGNOSIS — R109 Unspecified abdominal pain: Secondary | ICD-10-CM

## 2014-07-27 MED ORDER — TRAMADOL HCL 50 MG PO TABS: ORAL_TABLET | ORAL | Status: DC

## 2014-07-27 NOTE — Assessment & Plan Note (Signed)
History of bilateral open inguinal hernia repairs in the distant past at [facility] surgery. symptoms are recurrent on the left side and highly suggestive of a recurrent hernia. We are going to obtain a CT scan as well as urinalysis to ensure that this does not represent nephrolithiasis. Hip musculoskeletal exam was unremarkable. Referral to [facility] surgery for consideration of repeat correction.

## 2014-07-27 NOTE — Progress Notes (Signed)
Patient ID: Jacob Sutton, male   DOB: 1971/01/27, 43 y.o.   MRN: [id number]  Subjective:    CC: Left-sided groin pain  HPI: Jacob Sutton is a very pleasant 43 year old man with history of bilateral open inguinal hernia repair 4-5 years ago who presents with two weeks of worsening pain in the left inguinal region and a sensation of left testicular pain/fullness. Pain is worse with sitting or bending, improves with standing or lying down. States that the left testicle feels full, but he is unable to palpate a bulge. Reports that multiple physicians were unable to palpate his previous hernias, which were finally diagnosed by CT scan. He has attempted to manage his pain with Advil, but this provides no relief. No dysuria, hematuria, urethral discharge, flank pain or back pain. No fevers, chills or night sweats. No signs of strangulation with no nausea, vomiting, or changes in bowel habits.  Past medical history, Surgical history, Family history not pertinant except as noted below, Social history, Allergies, and medications have been entered into the medical record, reviewed, and no changes needed.   Review of systems as above.  Objective:    General: Well developed, well nourished, and in no acute distress.  Neuro: Alert and oriented x3, extra-ocular muscles intact, sensation grossly intact.  HEENT: Normocephalic, atraumatic, pupils equal round reactive to light, neck supple. Skin: Warm and dry, no rashes. Cardiac: Regular rate and rhythm, no murmurs rubs or gallops, no lower extremity edema.  Respiratory: Clear to auscultation bilaterally. Not using accessory muscles, speaking in full sentences.  Left Hip: ROM IR: 45 Deg, ER: 45 Deg, Flexion: 120 Deg, Extension: 100 Deg, Abduction: 45 Deg, Adduction: 45 Deg Strength IR: 5/5, ER: 5/5, Flexion: 5/5, Extension: 5/5, Abduction: 5/5, Adduction: 5/5 Pelvic alignment unremarkable to inspection and palpation. Standing hip rotation and gait without  trendelenburg sign/unsteadiness. No pain with FABER or FADIR. No SI joint tenderness and normal minimal SI movement.  Impression and Recommendations:   Groin Pain: Recurrent hernia is the most likely diagnosis in this patient with history of inguinal hernia, new-onset groin discomfort that is worse with increased intraabdominal pressure, and a feeling of testicular discomfort/fullness. Although the patient reports no dysuria, hematuria, or flank pain, this pain description could also represent nephrolithiasis. This location could also be consistent with hip pathology; however, the hip exam was unremarkable and this is thus unlikely. - CT scan - UA to r/o nephrolithiasis - Referral to [facility] for consideration of repeat correction.

## 2014-07-27 NOTE — Telephone Encounter (Signed)
Prior Auth for CT abdomen pelvis with contrast - Auth # [id number]

## 2014-07-28 ENCOUNTER — Ambulatory Visit (INDEPENDENT_AMBULATORY_CARE_PROVIDER_SITE_OTHER): Payer: BC Managed Care – PPO

## 2014-07-28 DIAGNOSIS — Z9889 Other specified postprocedural states: Secondary | ICD-10-CM

## 2014-07-28 DIAGNOSIS — R1032 Left lower quadrant pain: Secondary | ICD-10-CM

## 2014-07-28 DIAGNOSIS — R109 Unspecified abdominal pain: Secondary | ICD-10-CM

## 2014-07-28 LAB — URINALYSIS
Bilirubin Urine: NEGATIVE
Glucose, UA: NEGATIVE mg/dL
Hgb urine dipstick: NEGATIVE
Ketones, ur: NEGATIVE mg/dL
Leukocytes, UA: NEGATIVE
Nitrite: NEGATIVE
Protein, ur: NEGATIVE mg/dL
Specific Gravity, Urine: 1.025 (ref 1.005–1.030)
Urobilinogen, UA: 0.2 mg/dL (ref 0.0–1.0)
pH: 6 (ref 5.0–8.0)

## 2014-07-28 MED ORDER — IOHEXOL 300 MG/ML  SOLN
100.0000 mL | Freq: Once | INTRAMUSCULAR | Status: AC | PRN
  Administered 2014-07-28: 100 mL via INTRAVENOUS

## 2014-07-31 ENCOUNTER — Ambulatory Visit (INDEPENDENT_AMBULATORY_CARE_PROVIDER_SITE_OTHER): Payer: BC Managed Care – PPO | Admitting: Sports Medicine

## 2014-07-31 ENCOUNTER — Encounter: Payer: Self-pay | Admitting: Sports Medicine

## 2014-07-31 VITALS — BP 123/79 | HR 76 | Ht 72.0 in | Wt 201.0 lb

## 2014-07-31 DIAGNOSIS — R1032 Left lower quadrant pain: Secondary | ICD-10-CM

## 2014-07-31 DIAGNOSIS — R109 Unspecified abdominal pain: Secondary | ICD-10-CM

## 2014-07-31 NOTE — Progress Notes (Signed)
  Subjective:    CC: Followup  HPI: Jacob Sutton returns, he has a history of a bilateral inguinal hernia repair diagnosed via CT scan, unfortunately he has had a recurrence of left-sided lower pelvic and scrotal pressure symptoms, particularly with sitting rather than standing or laying flat. We did feel a palpable bulge in his anterior pelvis but not in the internal inguinal ring at the previous visit, and subsequent CT scan with oral and IV contrast did not reveal an inguinal hernia however he was supine. Symptoms are persistent. He does have an appointment coming up with [facility] next month. No changes in bowel or bladder function, no nausea vomiting or diarrhea. No severe abdominal pain.  Past medical history, Surgical history, Family history not pertinant except as noted below, Social history, Allergies, and medications have been entered into the medical record, reviewed, and no changes needed.   Review of Systems: No fevers, chills, night sweats, weight loss, chest pain, or shortness of breath.   Objective:    General: Well Developed, well nourished, and in no acute distress.  Neuro: Alert and oriented x3, extra-ocular muscles intact, sensation grossly intact.  HEENT: Normocephalic, atraumatic, pupils equal round reactive to light, neck supple, no masses, no lymphadenopathy, thyroid nonpalpable.  Skin: Warm and dry, no rashes. Cardiac: Regular rate and rhythm, no murmurs rubs or gallops, no lower extremity edema.  Respiratory: Clear to auscultation bilaterally. Not using accessory muscles, speaking in full sentences. Abdomen: Soft, nontender, nondistended, normal bowel sounds, no palpable masses, no palpable bulges with Valsalva in the internal inguinal ring, I do feel a palpable bulge, left worse than right in the anterior lower pelvis with Valsalva. No testicular masses, no scrotal swelling.  CT scan of the abdomen and pelvis with oral and IV contrast is negative with the  exception of lumbar L5-S1 degenerative disc disease.  Impression and Recommendations:

## 2014-07-31 NOTE — Assessment & Plan Note (Signed)
CT scan has not shown inguinal hernia however I am able to palpate a bulge in his left lower pelvis. Examination of the internal inguinal ring is unremarkable and there were no testicular masses. Further management will be deferred to Gen. surgery.

## 2014-08-13 ENCOUNTER — Telehealth: Payer: Self-pay | Admitting: *Deleted

## 2014-08-13 ENCOUNTER — Other Ambulatory Visit: Payer: Self-pay | Admitting: *Deleted

## 2014-08-13 DIAGNOSIS — R1032 Left lower quadrant pain: Secondary | ICD-10-CM

## 2014-08-13 MED ORDER — TRAMADOL HCL 50 MG PO TABS: ORAL_TABLET | ORAL | Status: DC

## 2014-08-13 NOTE — Telephone Encounter (Signed)
Yes please refill 

## 2014-08-13 NOTE — Telephone Encounter (Signed)
Refill request sent for tramadol from pharmacy. When patient was seen for groin pain a referral was sent to see general surgery. I see a referral but not an appt. I made an attempt without success to reach patient regarding appointment. Can I refill the tramadol? Please advise. Margette Fast, CMA

## 2014-08-25 ENCOUNTER — Telehealth: Payer: Self-pay | Admitting: *Deleted

## 2014-08-25 NOTE — Telephone Encounter (Signed)
Request for 90 tramadol denied which was refilled on 08/13/14. I called [facility] and told them it was too soon for refill. Margette Fast, CMA

## 2015-02-26 IMAGING — CR DG HAND COMPLETE 3+V*R*
3 series · 3 of 3 positions shown · non-contrast
Comparison: 02/26/2013.

CLINICAL DATA: Reevaluate the fifth metacarpal fracture.

RIGHT HAND - COMPLETE 3+ VIEW

[view not recorded (1 of 3)]
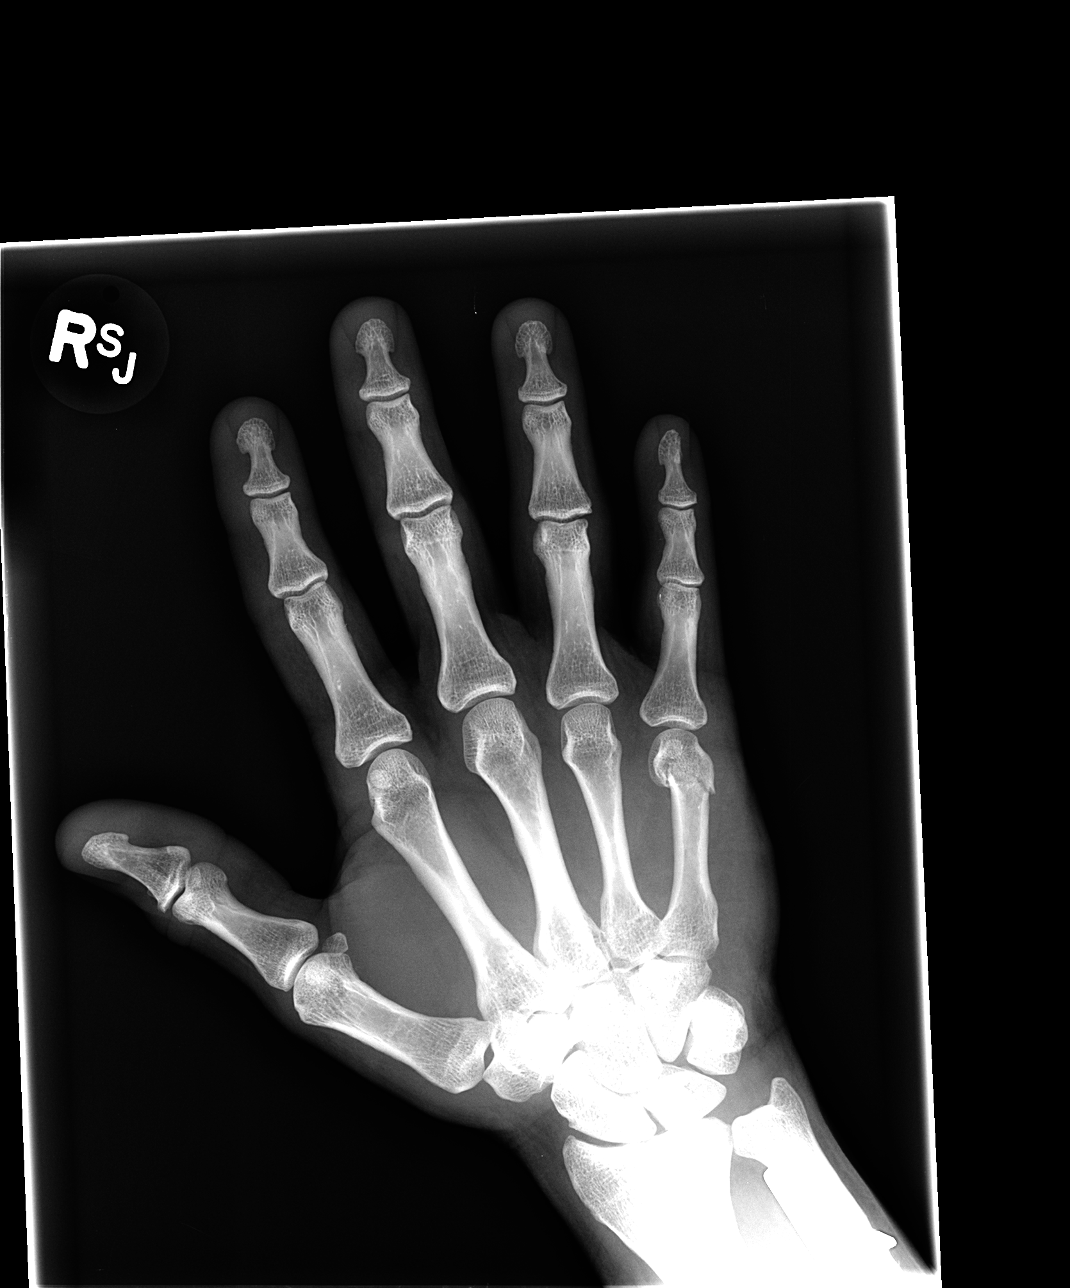

[view not recorded (2 of 3)]
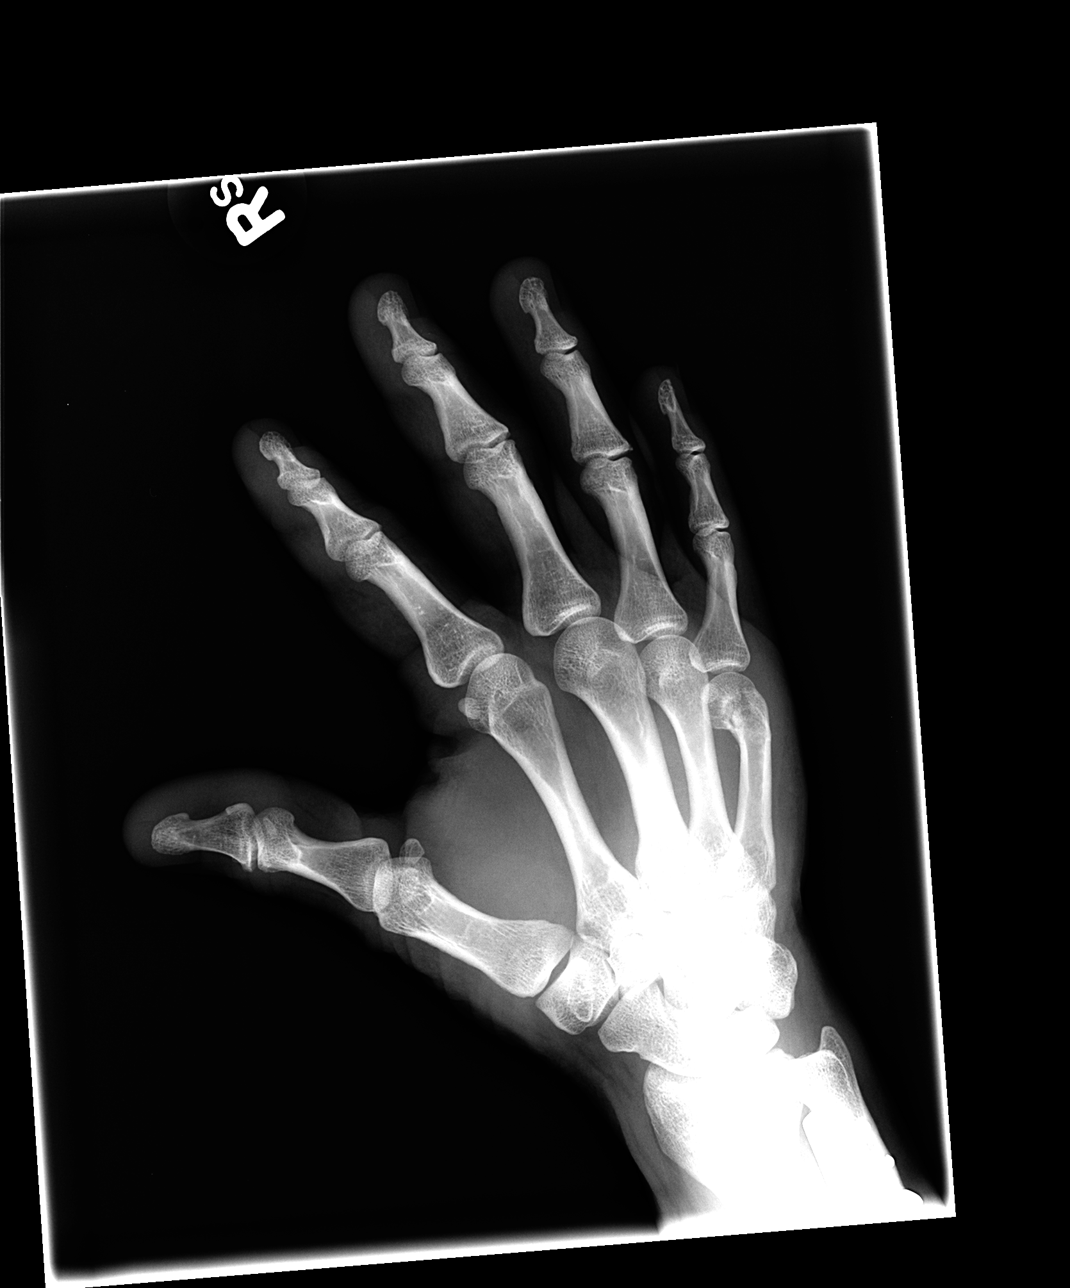

[view not recorded (3 of 3)]
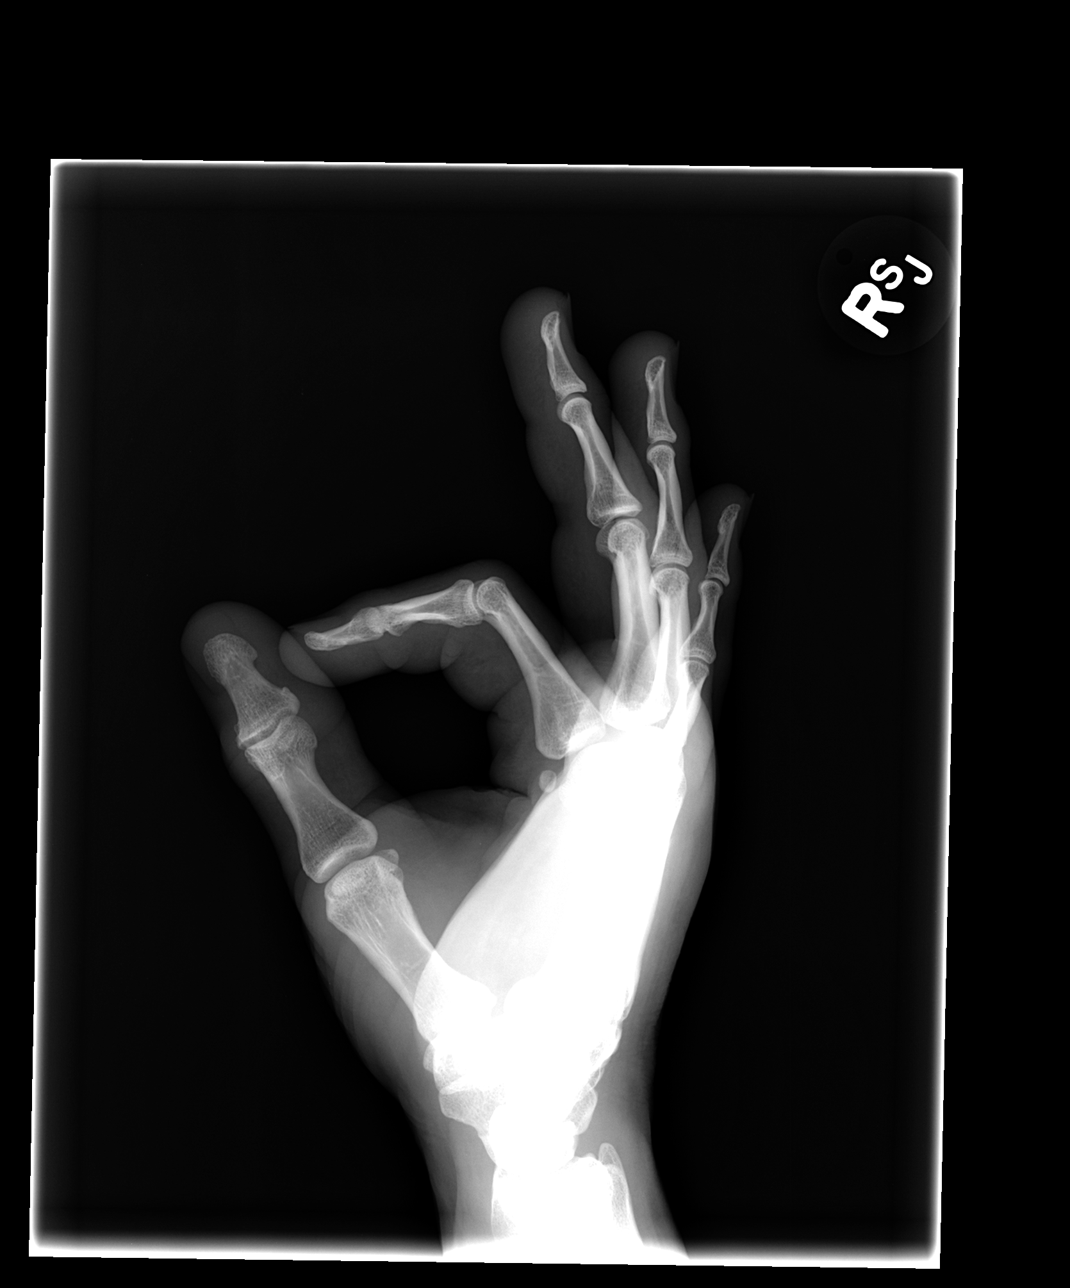

[3 of 3 positions shown; findings below may reference images not displayed]

FINDINGS: Again noted is a nondisplaced mildly angulated fracture
of the distal fifth metacarpal.  Specifically, there is
approximately 30 degrees of volar angulation which is unchanged.
Overlying soft tissues appears slightly swollen.  No appreciable
callus formation is noted at this time.  The remainder of the bones
of the right hand are otherwise intact.
IMPRESSION: 1.  No significant change and mildly angulated fracture of the
distal fifth metacarpal.

## 2015-05-18 ENCOUNTER — Ambulatory Visit (INDEPENDENT_AMBULATORY_CARE_PROVIDER_SITE_OTHER): Payer: BLUE CROSS/BLUE SHIELD | Admitting: Sports Medicine

## 2015-05-18 ENCOUNTER — Encounter: Payer: Self-pay | Admitting: Sports Medicine

## 2015-05-18 VITALS — BP 131/74 | HR 90 | Temp 98.1°F | Wt 202.0 lb

## 2015-05-18 DIAGNOSIS — H6121 Impacted cerumen, right ear: Secondary | ICD-10-CM

## 2015-05-18 DIAGNOSIS — Z0000 Encounter for general adult medical examination without abnormal findings: Secondary | ICD-10-CM | POA: Diagnosis not present

## 2015-05-18 DIAGNOSIS — Z23 Encounter for immunization: Secondary | ICD-10-CM

## 2015-05-18 NOTE — Progress Notes (Signed)
  Subjective:    CC: Complete physical exam  HPI:  This is a healthy 44 year old male, he has no complaints, he is healthy.  Preventive measures: Due for tetanus vaccine.  Past medical history, Surgical history, Family history not pertinant except as noted below, Social history, Allergies, and medications have been entered into the medical record, reviewed, and no changes needed.   Review of Systems: No headache, visual changes, nausea, vomiting, diarrhea, constipation, dizziness, abdominal pain, skin rash, fevers, chills, night sweats, swollen lymph nodes, weight loss, chest pain, body aches, joint swelling, muscle aches, shortness of breath, mood changes, visual or auditory hallucinations.  Objective:    General: Well Developed, well nourished, and in no acute distress.  Neuro: Alert and oriented x3, extra-ocular muscles intact, sensation grossly intact. Cranial nerves II through XII are intact, motor, sensory, and coordinative functions are all intact. HEENT: Normocephalic, atraumatic, pupils equal round reactive to light, neck supple, no masses, no lymphadenopathy, thyroid nonpalpable. Oropharynx, nasopharynx, external ear canals are unremarkable. Right-sided cerumen impaction Skin: Warm and dry, no rashes noted.  Cardiac: Regular rate and rhythm, no murmurs rubs or gallops.  Respiratory: Clear to auscultation bilaterally. Not using accessory muscles, speaking in full sentences.  Abdominal: Soft, nontender, nondistended, positive bowel sounds, no masses, no organomegaly.  Musculoskeletal: Shoulder, elbow, wrist, hip, knee, ankle stable, and with full range of motion.  Indication: Cerumen impaction of the ear(s) Medical necessity statement: On physical examination, cerumen impairs clinically significant portions of the external auditory canal, and tympanic membrane. Noted obstructive, copious cerumen that cannot be removed without magnification and instrumentations requiring physician  skills Consent: Discussed benefits and risks of procedure and verbal consent obtained Procedure: Patient was prepped for the procedure. Utilized an otoscope to assess and take note of the ear canal, the tympanic membrane, and the presence, amount, and placement of the cerumen. Gentle water irrigation and soft plastic curette was utilized to remove cerumen.  Post procedure examination: shows cerumen was completely removed. Patient tolerated procedure well. The patient is made aware that they may experience temporary vertigo, temporary hearing loss, and temporary discomfort. If these symptom last for more than 24 hours to call the clinic or proceed to the ED.  Impression and Recommendations:    The patient was counselled, risk factors were discussed, anticipatory guidance given.

## 2015-05-18 NOTE — Assessment & Plan Note (Signed)
CPE as above. Labs.

## 2015-05-25 LAB — LIPID PANEL
Cholesterol: 201 mg/dL — ABNORMAL HIGH (ref 0–200)
HDL: 48 mg/dL (ref >=40)
LDL Cholesterol: 132 mg/dL — ABNORMAL HIGH (ref 0–99)
Total CHOL/HDL Ratio: 4.2 ratio
Triglycerides: 105 mg/dL (ref <150)
VLDL: 21 mg/dL (ref 0–40)

## 2015-05-25 LAB — VITAMIN D 25 HYDROXY (VIT D DEFICIENCY, FRACTURES): Vit D, 25-Hydroxy: 37 ng/mL (ref 30–100)

## 2015-05-25 LAB — HEMOGLOBIN A1C
Hgb A1c MFr Bld: 5.5 % (ref <5.7)
Mean Plasma Glucose: 111 mg/dL (ref <117)

## 2016-05-18 ENCOUNTER — Ambulatory Visit (INDEPENDENT_AMBULATORY_CARE_PROVIDER_SITE_OTHER): Payer: BLUE CROSS/BLUE SHIELD | Admitting: Sports Medicine

## 2016-05-18 ENCOUNTER — Encounter: Payer: Self-pay | Admitting: Sports Medicine

## 2016-05-18 VITALS — BP 144/77 | HR 72 | Resp 18 | Ht 72.0 in | Wt 203.9 lb

## 2016-05-18 DIAGNOSIS — H9193 Unspecified hearing loss, bilateral: Secondary | ICD-10-CM | POA: Diagnosis not present

## 2016-05-18 DIAGNOSIS — H9190 Unspecified hearing loss, unspecified ear: Secondary | ICD-10-CM | POA: Insufficient documentation

## 2016-05-18 DIAGNOSIS — Z0000 Encounter for general adult medical examination without abnormal findings: Secondary | ICD-10-CM | POA: Diagnosis not present

## 2016-05-18 DIAGNOSIS — M7711 Lateral epicondylitis, right elbow: Secondary | ICD-10-CM

## 2016-05-18 MED ORDER — MELOXICAM 15 MG PO TABS: ORAL_TABLET | ORAL | Status: DC

## 2016-05-18 NOTE — Assessment & Plan Note (Signed)
Counterforce brace, rehabilitation exercises, meloxicam, return in one month, injection if no better.

## 2016-05-18 NOTE — Assessment & Plan Note (Signed)
With essentially unremarkable. Normal exam, no tinnitus, referral to audiology. 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

## 2016-05-18 NOTE — Assessment & Plan Note (Signed)
Compete physical was above,routine blood work ordered.

## 2016-05-18 NOTE — Progress Notes (Signed)
  Subjective:    CC: CPE  HPI:  This is a pleasant 45 year old male, he is here for his physical.  Right elbow pain: Moderate, persistent, localized over the lateral epicondyle, no radiation, worse with extension of the wrist.  Hearing loss: Previously has been attributable to cerumen impactions, works with loud machinery at work, having greater difficulty hearing progressively. No tinnitus. No head trauma.  Past medical history, Surgical history, Family history not pertinant except as noted below, Social history, Allergies, and medications have been entered into the medical record, reviewed, and no changes needed.   Review of Systems: No headache, visual changes, nausea, vomiting, diarrhea, constipation, dizziness, abdominal pain, skin rash, fevers, chills, night sweats, swollen lymph nodes, weight loss, chest pain, body aches, joint swelling, muscle aches, shortness of breath, mood changes, visual or auditory hallucinations.  Objective:    General: Well Developed, well nourished, and in no acute distress.  Neuro: Alert and oriented x3, extra-ocular muscles intact, sensation grossly intact. Cranial nerves II through XII are intact, motor, sensory, and coordinative functions are all intact. HEENT: Normocephalic, atraumatic, pupils equal round reactive to light, neck supple, no masses, no lymphadenopathy, thyroid nonpalpable. Oropharynx, nasopharynx, external ear canals are unremarkable. Skin: Warm and dry, no rashes noted.  Cardiac: Regular rate and rhythm, no murmurs rubs or gallops.  Respiratory: Clear to auscultation bilaterally. Not using accessory muscles, speaking in full sentences.  Abdominal: Soft, nontender, nondistended, positive bowel sounds, no masses, no organomegaly.  Right Elbow: Unremarkable to inspection. Range of motion full pronation, supination, flexion, extension. Strength is full to all of the above directions Stable to varus, valgus stress. Negative moving valgus  stress test. Tender to palpation, extensor tendon origin Ulnar nerve does not sublux. Negative cubital tunnel Tinel's.  Impression and Recommendations:    The patient was counselled, risk factors were discussed, anticipatory guidance given.

## 2016-06-12 DIAGNOSIS — Z0000 Encounter for general adult medical examination without abnormal findings: Secondary | ICD-10-CM | POA: Diagnosis not present

## 2016-06-12 LAB — LIPID PANEL
Cholesterol: 191 mg/dL (ref 125–200)
HDL: 49 mg/dL (ref >=40)
LDL Cholesterol: 121 mg/dL (ref <130)
Total CHOL/HDL Ratio: 3.9 ratio (ref <=5.0)
Triglycerides: 105 mg/dL (ref <150)
VLDL: 21 mg/dL (ref <30)

## 2016-06-12 LAB — COMPREHENSIVE METABOLIC PANEL
ALT: 24 U/L (ref 9–46)
AST: 20 U/L (ref 10–40)
Alkaline Phosphatase: 66 U/L (ref 40–115)
BUN: 18 mg/dL (ref 7–25)
Creat: 0.84 mg/dL (ref 0.60–1.35)
Sodium: 138 mmol/L (ref 135–146)
Total Bilirubin: 0.5 mg/dL (ref 0.2–1.2)
Total Protein: 6.8 g/dL (ref 6.1–8.1)

## 2016-06-12 LAB — CBC
HCT: 42.3 % (ref 38.5–50.0)
Hemoglobin: 14.7 g/dL (ref 13.2–17.1)
MCH: 29.7 pg (ref 27.0–33.0)
MCHC: 34.8 g/dL (ref 32.0–36.0)
MCV: 85.5 fL (ref 80.0–100.0)
MPV: 9.3 fL (ref 7.5–12.5)
Platelets: 214 10*3/uL (ref 140–400)
RBC: 4.95 MIL/uL (ref 4.20–5.80)
RDW: 13.4 % (ref 11.0–15.0)
WBC: 5.6 K/uL (ref 3.8–10.8)

## 2016-06-12 LAB — COMPREHENSIVE METABOLIC PANEL WITH GFR
Albumin: 4.2 g/dL (ref 3.6–5.1)
CO2: 27 mmol/L (ref 20–31)
Calcium: 9.3 mg/dL (ref 8.6–10.3)
Chloride: 104 mmol/L (ref 98–110)
Glucose, Bld: 91 mg/dL (ref 65–99)
Potassium: 5.3 mmol/L (ref 3.5–5.3)

## 2016-06-12 LAB — TSH: TSH: 1.91 mIU/L (ref 0.40–4.50)

## 2016-06-13 LAB — HEMOGLOBIN A1C
Hgb A1c MFr Bld: 5 % (ref <5.7)
Mean Plasma Glucose: 97 mg/dL

## 2016-06-13 LAB — VITAMIN D 25 HYDROXY (VIT D DEFICIENCY, FRACTURES): Vit D, 25-Hydroxy: 45 ng/mL (ref 30–100)

## 2016-06-15 ENCOUNTER — Ambulatory Visit (INDEPENDENT_AMBULATORY_CARE_PROVIDER_SITE_OTHER): Payer: BLUE CROSS/BLUE SHIELD | Admitting: Sports Medicine

## 2016-06-15 ENCOUNTER — Encounter: Payer: Self-pay | Admitting: Sports Medicine

## 2016-06-15 DIAGNOSIS — M7711 Lateral epicondylitis, right elbow: Secondary | ICD-10-CM | POA: Diagnosis not present

## 2016-06-15 NOTE — Assessment & Plan Note (Signed)
No better with rehabilitation exercises, injection as above, he will be more diligent with rehabilitation exercises, return in one month.

## 2016-06-15 NOTE — Progress Notes (Signed)
  Subjective:    CC: Follow-up  HPI: Right lateral epicondylitis: Improved little bit with rehabilitation exercises and NSAIDs as well as bracing however still has pain, desires interventional treatment today, pain is moderate, persistent, localized without radiation.  Past medical history, Surgical history, Family history not pertinant except as noted below, Social history, Allergies, and medications have been entered into the medical record, reviewed, and no changes needed.   Review of Systems: No fevers, chills, night sweats, weight loss, chest pain, or shortness of breath.   Objective:    General: Well Developed, well nourished, and in no acute distress.  Neuro: Alert and oriented x3, extra-ocular muscles intact, sensation grossly intact.  HEENT: Normocephalic, atraumatic, pupils equal round reactive to light, neck supple, no masses, no lymphadenopathy, thyroid nonpalpable.  Skin: Warm and dry, no rashes. Cardiac: Regular rate and rhythm, no murmurs rubs or gallops, no lower extremity edema.  Respiratory: Clear to auscultation bilaterally. Not using accessory muscles, speaking in full sentences. Right Elbow: Unremarkable to inspection. Range of motion full pronation, supination, flexion, extension. Strength is full to all of the above directions Stable to varus, valgus stress. Negative moving valgus stress test. Tender to palpation at the common extensor tendon origin Ulnar nerve does not sublux. Negative cubital tunnel Tinel's.  Procedure: Real-time Ultrasound Guided Injection of right common extensor tendon origin Device: GE Logiq E  Verbal informed consent obtained.  Time-out conducted.  Noted no overlying erythema, induration, or other signs of local infection.  Skin prepped in a sterile fashion.  Local anesthesia: Topical Ethyl chloride.  With sterile technique and under real time ultrasound guidance:  1 mL kenalog 40, 1 mL lidocaine, 1 mL Marcaine injected easily both  superficial to and deep to the origin of the common extensor tendon Completed without difficulty  Pain immediately resolved suggesting accurate placement of the medication.  Advised to call if fevers/chills, erythema, induration, drainage, or persistent bleeding.  Images permanently stored and available for review in the ultrasound unit.  Impression: Technically successful ultrasound guided injection.  Impression and Recommendations:    Lateral epicondylitis of right elbow No better with rehabilitation exercises, injection as above, he will be more diligent with rehabilitation exercises, return in one month.

## 2016-07-13 ENCOUNTER — Ambulatory Visit: Payer: BLUE CROSS/BLUE SHIELD | Admitting: Sports Medicine

## 2016-08-07 ENCOUNTER — Encounter: Payer: Self-pay | Admitting: *Deleted

## 2016-08-07 ENCOUNTER — Emergency Department
Admission: EM | Admit: 2016-08-07 | Discharge: 2016-08-07 | Disposition: A | Discharge status: Home / Self Care | Payer: BLUE CROSS/BLUE SHIELD | Source: Home / Self Care | Attending: Family Medicine | Admitting: Family Medicine

## 2016-08-07 ENCOUNTER — Emergency Department (INDEPENDENT_AMBULATORY_CARE_PROVIDER_SITE_OTHER): Payer: BLUE CROSS/BLUE SHIELD

## 2016-08-07 DIAGNOSIS — S61412A Laceration without foreign body of left hand, initial encounter: Secondary | ICD-10-CM | POA: Diagnosis not present

## 2016-08-07 DIAGNOSIS — X58XXXA Exposure to other specified factors, initial encounter: Secondary | ICD-10-CM

## 2016-08-07 DIAGNOSIS — S61215A Laceration without foreign body of left ring finger without damage to nail, initial encounter: Secondary | ICD-10-CM

## 2016-08-07 DIAGNOSIS — S6992XA Unspecified injury of left wrist, hand and finger(s), initial encounter: Secondary | ICD-10-CM | POA: Diagnosis not present

## 2016-08-07 MED ORDER — HYDROCODONE-ACETAMINOPHEN 5-325 MG PO TABS: 1.0000 | ORAL_TABLET | Freq: Four times a day (QID) | ORAL | 0 refills | Status: DC | PRN

## 2016-08-07 NOTE — ED Provider Notes (Signed)
CSN: [id number]     Arrival date & time 08/07/16  1403 History   First MD Initiated Contact with Patient 08/07/16 1423     Chief Complaint  Patient presents with  . Laceration   (Consider location/radiation/quality/duration/timing/severity/associated sxs/prior Treatment) HPI  Jacob Sutton is a 45 y.o. male presenting to UC with c/o laceration to his Left 4th finger that occurred about 30 minutes PTA after cutting it on a grinder machine.  Bleeding controlled with pressure.  He is not on blood thinners. Full ROM with mild intermittent tingling to tip of his finger.  No pain medication taken PTA. Pain is 4/10, aching.  He is Right hand dominant.    History reviewed. No pertinent past medical history. Past Surgical History:  Procedure Laterality Date  . APPENDECTOMY    . CARDIAC CATHETERIZATION  11/06/12   Clean.  Marland Kitchen HERNIA REPAIR    . VASECTOMY     Family History  Problem Relation Age of Onset  . Heart disease Mother   . Heart disease Father   . Diabetes Father    Social History  Substance Use Topics  . Smoking status: Never Smoker  . Smokeless tobacco: Never Used  . Alcohol use Yes    Review of Systems  Musculoskeletal: Positive for myalgias. Negative for arthralgias and joint swelling.  Skin: Positive for wound. Negative for color change.       Left 4th finger  Neurological: Positive for numbness. Negative for weakness.    Allergies  Review of patient's allergies indicates no known allergies.  Home Medications   Prior to Admission medications   Medication Sig Start Date End Date Taking? Authorizing Provider  HYDROcodone-acetaminophen (NORCO/VICODIN) 5-325 MG tablet Take 1-2 tablets by mouth every 6 (six) hours as needed. 08/07/16   Noland Fordyce, PA-C  meloxicam (MOBIC) 15 MG tablet One tab PO qAM with breakfast for 2 weeks, then daily prn pain. 05/18/16   Silverio Decamp, MD   Meds Ordered and Administered this Visit  Medications - No data to display  BP 142/91  (BP Location: Left Arm)   Pulse 74   SpO2 98%  No data found.   Physical Exam  Constitutional: He is oriented to person, place, and time. He appears well-developed and well-nourished.  HENT:  Head: Normocephalic and atraumatic.  Eyes: EOM are normal.  Neck: Normal range of motion.  Cardiovascular: Normal rate.   Pulmonary/Chest: Effort normal.  Musculoskeletal: Normal range of motion. He exhibits edema and tenderness.  Left fourth finger: mild edema to proximal phalanx. Full ROM. Mild tenderness to proximal phalanx secondary to laceration (see skin exam).  Neurological: He is alert and oriented to person, place, and time.  Left fourth finger: normal sensation distal to laceration  Skin: Skin is warm and dry. Capillary refill takes less than 2 seconds.  Left fourth finger, volar aspect, proximal phalanx into PIP crease: 1cm laceration, adipose tissue exposed.  Small amount of oozing blood. No foreign bodies seen or palpated.  Psychiatric: He has a normal mood and affect. His behavior is normal.  Nursing note and vitals reviewed.   Urgent Care Course   Clinical Course    .Marland KitchenLaceration Repair Date/Time: 08/07/2016 4:14 PM Performed by: Noland Fordyce Authorized by: Theone Murdoch A   Consent:    Consent obtained:  Verbal   Consent given by:  Patient   Risks discussed:  Infection, pain, nerve damage, poor wound healing and poor cosmetic result Anesthesia (see MAR for exact dosages):  Anesthesia method:  Nerve block   Block needle gauge:  25 G   Block anesthetic:  Lidocaine 2% w/o epi   Block technique:  Digital   Block injection procedure:  Introduced needle, incremental injection, anatomic landmarks palpated, anatomic landmarks identified and negative aspiration for blood   Block outcome:  Anesthesia achieved Laceration details:    Location:  Finger   Finger location:  L ring finger   Length (cm):  1   Depth (mm):  3 Repair type:    Repair type:  Simple Pre-procedure  details:    Preparation:  Patient was prepped and draped in usual sterile fashion and imaging obtained to evaluate for foreign bodies (no bony injury of foreign bodies seen on imaging) Exploration:    Hemostasis achieved with:  Direct pressure   Wound exploration: wound explored through full range of motion and entire depth of wound probed and visualized     Wound extent: no fascia violation noted, no foreign bodies/material noted, no muscle damage noted, no nerve damage noted, no tendon damage noted, no underlying fracture noted and no vascular damage noted     Contaminated: no   Treatment:    Area cleansed with:  Saline and Betadine   Amount of cleaning:  Extensive   Irrigation solution:  Sterile saline   Irrigation volume:  30   Irrigation method:  Syringe Skin repair:    Repair method:  Sutures   Suture size:  5-0   Suture material:  Nylon (Ethilon )   Suture technique:  Simple interrupted   Number of sutures:  3 Approximation:    Approximation:  Close   Vermilion border: well-aligned   Post-procedure details:    Dressing:  Antibiotic ointment and adhesive bandage   Patient tolerance of procedure:  Tolerated well, no immediate complications   (including critical care time)  Labs Review Labs Reviewed - No data to display  Imaging Review Dg Hand Complete Left  Result Date: 08/07/2016 CLINICAL DATA:  Cut hand with saw earlier today EXAM: LEFT HAND - COMPLETE 3+ VIEW COMPARISON:  None. FINDINGS: Frontal, oblique, and lateral views obtained. No radiopaque foreign body. No fracture or dislocation. There is a tiny subchondral cyst in the medial aspect of the second middle phalanx distally. No appreciable joint space narrowing or intra-articular calcification. IMPRESSION: No radiopaque foreign body. No fracture or dislocation. No appreciable joint space narrowing. Electronically Signed   By: Lowella Grip III M.D.   On: 08/07/2016 14:52    MDM   1. Laceration of left ring  finger without foreign body without damage to nail, initial encounter    Laceration to Left 4th finger. No foreign bodies or bony injury noted on exam or imaging.   Wound cleaned and sutured closed as noted above. Rx: Norco (6 tabs for severe pain), may take ibuprofen   F/u with Dr. Bartholome Bill, his PCP, or return to UC in 10-14 days for suture removal, sooner if signs of infection. Patient verbalized understanding and agreement with treatment plan.       Noland Fordyce, PA-C 08/07/16 [phone]

## 2016-08-07 NOTE — Discharge Instructions (Signed)
°  Norco/Vicodin (hydrocodone-acetaminophen) is a narcotic pain medication, do not combine these medications with others containing tylenol. While taking, do not drink alcohol, drive, or perform any other activities that requires focus while taking these medications.

## 2016-08-07 NOTE — ED Triage Notes (Signed)
Pt reports cutting his left 4th finger on a grinder about 30 minutes ago. Reports TDaP is UTD. He has movement and sensation in the fingertip but c/o tingling. Site cleaned with Hibiclens but continues to bleed. Denies use of anticoagulants.

## 2016-08-15 ENCOUNTER — Ambulatory Visit: Payer: BLUE CROSS/BLUE SHIELD | Attending: Sports Medicine | Admitting: Audiology

## 2016-08-15 DIAGNOSIS — H93299 Other abnormal auditory perceptions, unspecified ear: Secondary | ICD-10-CM

## 2016-08-15 DIAGNOSIS — H833X3 Noise effects on inner ear, bilateral: Secondary | ICD-10-CM

## 2016-08-15 DIAGNOSIS — Z0110 Encounter for examination of ears and hearing without abnormal findings: Secondary | ICD-10-CM | POA: Diagnosis not present

## 2016-08-15 DIAGNOSIS — Z7189 Other specified counseling: Secondary | ICD-10-CM | POA: Diagnosis not present

## 2016-08-15 NOTE — Procedures (Signed)
[facility]  [place], [place] [address]  [phone]   Audiological Evaluation  Patient Name: Jacob Sutton   Status: Outpatient   DOB: 07-01-1971    Diagnosis: Hearing Loss                Abnormal hearing screen MRN: [id number] Date:  08/15/2016     Referent: Aundria Mems, MD  History: Jacob Sutton was seen for an audiological evaluation. 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   History of ear infections:  N History of dizziness:  N Tinnitus: N History of balance issues:  N History of occupational noise exposure: Y Grinders and welding History of hypertension: N History of diabetes:  N Family history of hearing loss:  Y  Father in old age Medications: None   Evaluation: Conventional pure tone audiometry from 250Hz  - 8000Hz  with using insert earphones. Hearing thresholds are symmetrical with 10-20 dBHL from 250Hz  - 1000Hz ; 25-30dBHL at 1500Hz ; 40-55 dBHL at 2000hz ; 60-65 dBHL at 3000-4000Hz ; and 40-50 dbHL at 8000Hz  bilaterally. Reliability is good Speech reception levels (repeating words near threshold) using recorded spondee word lists:  Right ear: 25 dBHL.  Left ear:  20 dBHL Word recognition (at comfortably loud volumes) using NU-6 recorded word lists, in quiet.  Right ear: 96% at 65 dBHL.  Left ear:   100% at 60 dBHL Word recognition in minimal background noise:  +5 dBHL  Right ear: 68%                              Left ear:  68%  Tympanometry (middle ear function) with ipsilateral acoustic reflexes.  Right ear: Normal (Type A) with present acoustic reflex at 1000Hz .  Left ear: Normal (Type A) with  present acoustic reflex at 1000Hz .  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 Jacob Sutton has excellent word recognition in quiet that drops to poor to borderline fair in minimal background noise - missing a significant amount of communication in restaurants, in the car and other areas of minimal noise is expected.  The hearing loss appears symmetrical and without recruitment.  This amount of hearing loss would adversely affect speech communication at normal conversational speech levels.  Jacob Sutton may be an excellent candidate and/or benefit from amplification; therefore a hearing aid evaluation is recommended.      Amplification helps make the signal louder and therefore often improves hearing and word recognition.  Amplification has many forms including hearing aids in one or both ears, an assistive listening device which have a microphone and speaker such as a small handheld device and/or even a surround sound system of speakers for use with a television.  Amplification may be covered by some insurances, but not all.  It is important to note that hearing aids must be individually fit according to the hearing test results and the ear shape.  Audiologists and hearing aid dealers in [place] must be licensed in order to dispense hearing aids.  In addition, a trial period is mandated by law in our state because often amplification must be tried and then evaluated in order to determine benefit.  There are many excellent choices when it comes to amplification in our area and providers are listed in the phone book under hearing aids, there are audiologists in private practice, those affiliated with Ear, Nose and Throat physicians, and there are audiologists located at [employer].   RECOMMENDATIONS: 1.   A hearing aid evaluation. 2.   Strategies that help improve hearing include: A) Face the speaker directly. Optimal is having the speakers face well - lit.  Unless amplified, being within 3-6 feet of the speaker will  enhance word recognition. B) Avoid having the speaker back-lit as this will minimize the ability to use cues from lip-reading, facial expression and gestures. C)  Word recognition is poorer in background noise. For optimal word recognition, turn off the TV, radio or noisy fan when engaging in conversation. In a restaurant, try to sit away from noise sources and close to the primary speaker.  D)  Ask for topic clarification from time to time in order to remain in the conversation.  Most people don't mind repeating or clarifying a point when asked.  If needed, explain the difficulty hearing in background noise or hearing loss. 3.   Use hearing protection during noisy activities such as using a weed eater, moving the lawn, shooting and at work.    Musician's plugs, are available from [employer] for music related hearing protection because there is no distortion.  Other hearing protection, such as sponge plugs (available at pharmacies) or earmuffs (available at sporting goods stores or department stores such as Paediatric nurse) are useful for noisy activities and venues.      Aolanis Crispen L. Heide Spark, Au.D., CCC-A Doctor of Audiology 08/15/2016  cc: Aundria Mems, MD

## 2017-01-12 ENCOUNTER — Encounter: Payer: Self-pay | Admitting: Sports Medicine

## 2017-01-12 ENCOUNTER — Ambulatory Visit (INDEPENDENT_AMBULATORY_CARE_PROVIDER_SITE_OTHER): Payer: BLUE CROSS/BLUE SHIELD | Admitting: Sports Medicine

## 2017-01-12 DIAGNOSIS — L723 Sebaceous cyst: Secondary | ICD-10-CM | POA: Diagnosis not present

## 2017-01-12 MED ORDER — OXYCODONE-ACETAMINOPHEN 5-325 MG PO TABS: 1.0000 | ORAL_TABLET | Freq: Three times a day (TID) | ORAL | 0 refills | Status: DC | PRN

## 2017-01-12 NOTE — Progress Notes (Signed)
  Subjective:    CC: Lump on back  HPI: For the past several years Jacob Sutton has noted a occasionally tender reddish lump on his right posterior shoulder. Occasionally it drains. Symptoms are moderate, persistent, it is growing slowly.  Past medical history:  Negative.  See flowsheet/record as well for more information.  Surgical history: Negative.  See flowsheet/record as well for more information.  Family history: Negative.  See flowsheet/record as well for more information.  Social history: Negative.  See flowsheet/record as well for more information.  Allergies, and medications have been entered into the medical record, reviewed, and no changes needed.   Review of Systems: No fevers, chills, night sweats, weight loss, chest pain, or shortness of breath.   Objective:    General: Well Developed, well nourished, and in no acute distress.  Neuro: Alert and oriented x3, extra-ocular muscles intact, sensation grossly intact.  HEENT: Normocephalic, atraumatic, pupils equal round reactive to light, neck supple, no masses, no lymphadenopathy, thyroid nonpalpable.  Skin: Warm and dry, no rashes.There is what feels to be a sebaceous cyst, approximately 3.7 cm across. Right posterior shoulder. Cardiac: Regular rate and rhythm, no murmurs rubs or gallops, no lower extremity edema.  Respiratory: Clear to auscultation bilaterally. Not using accessory muscles, speaking in full sentences.  Procedure:  Excision of right posterior shoulder sebaceous cyst, 3.7 cm Risks, benefits, and alternatives explained and consent obtained. Time out conducted. Surface prepped with alcohol. 10cc lidocaine infiltrated in a field block. Adequate anesthesia ensured. Area prepped and draped in a sterile fashion. Excision performed with: I made a linear incision over the cyst, using both sharp and blunt dissection I was ill drainage, and remove any remaining pieces of the cyst capsule, it was then closed with several 3-0  Ethilon simple interrupted sutures. Hemostasis achieved. Pt stable.  Impression and Recommendations:    Sebaceous cyst Posterior right shoulder, drainage and excision. Closed primarily, return in 10 days for suture removal and wound check. Percocet for pain.  I spent 25 minutes with this patient, greater than 50% was face-to-face time counseling regarding the above diagnoses, this was separate from the time spent performing the procedure.

## 2017-01-12 NOTE — Assessment & Plan Note (Signed)
Posterior right shoulder, drainage and excision. Closed primarily, return in 10 days for suture removal and wound check. Percocet for pain.

## 2017-01-22 ENCOUNTER — Encounter: Payer: Self-pay | Admitting: Sports Medicine

## 2017-01-22 ENCOUNTER — Ambulatory Visit (INDEPENDENT_AMBULATORY_CARE_PROVIDER_SITE_OTHER): Payer: BLUE CROSS/BLUE SHIELD | Admitting: Sports Medicine

## 2017-01-22 DIAGNOSIS — L723 Sebaceous cyst: Secondary | ICD-10-CM

## 2017-01-22 DIAGNOSIS — M7711 Lateral epicondylitis, right elbow: Secondary | ICD-10-CM | POA: Diagnosis not present

## 2017-01-22 NOTE — Assessment & Plan Note (Signed)
Doing well, wound is a erythematous around the edges but I don't suspect bacterial infection. I don't think it's ready to have the sutures out just yet, return in one week.

## 2017-01-22 NOTE — Progress Notes (Signed)
  Subjective:    CC: Follow-up  HPI: Sebaceous cyst: 10 days post excision, doing well, no pain.  Lateral epicondylitis, right: Starting to have a recurrence of pain, previous injection was 7 months ago, moderate, persistent without radiation.  Past medical history:  Negative.  See flowsheet/record as well for more information.  Surgical history: Negative.  See flowsheet/record as well for more information.  Family history: Negative.  See flowsheet/record as well for more information.  Social history: Negative.  See flowsheet/record as well for more information.  Allergies, and medications have been entered into the medical record, reviewed, and no changes needed.   Review of Systems: No fevers, chills, night sweats, weight loss, chest pain, or shortness of breath.   Objective:    General: Well Developed, well nourished, and in no acute distress.  Neuro: Alert and oriented x3, extra-ocular muscles intact, sensation grossly intact.  HEENT: Normocephalic, atraumatic, pupils equal round reactive to light, neck supple, no masses, no lymphadenopathy, thyroid nonpalpable.  Skin: Warm and dry, no rashes.Incision is clean, dry, intact, slightly erythematous around the edges. Cardiac: Regular rate and rhythm, no murmurs rubs or gallops, no lower extremity edema.  Respiratory: Clear to auscultation bilaterally. Not using accessory muscles, speaking in full sentences. Right Elbow: Unremarkable to inspection. Range of motion full pronation, supination, flexion, extension. Strength is full to all of the above directions Stable to varus, valgus stress. Negative moving valgus stress test. Tender to palpation of the common extensor tendon origin Ulnar nerve does not sublux. Negative cubital tunnel Tinel's.  Impression and Recommendations:    Lateral epicondylitis of right elbow Started to have a recurrence of pain, previous injection was 7 months ago. He is going to become more diligent again  with rehabilitation exercises. I'm not going to inject him today as his sebaceous cyst wound is still healing.  Sebaceous cyst Doing well, wound is a erythematous around the edges but I don't suspect bacterial infection. I don't think it's ready to have the sutures out just yet, return in one week.

## 2017-01-22 NOTE — Assessment & Plan Note (Signed)
Started to have a recurrence of pain, previous injection was 7 months ago. He is going to become more diligent again with rehabilitation exercises. I'm not going to inject him today as his sebaceous cyst wound is still healing.

## 2017-01-29 ENCOUNTER — Encounter: Payer: Self-pay | Admitting: Sports Medicine

## 2017-01-29 ENCOUNTER — Ambulatory Visit (INDEPENDENT_AMBULATORY_CARE_PROVIDER_SITE_OTHER): Payer: BLUE CROSS/BLUE SHIELD | Admitting: Sports Medicine

## 2017-01-29 DIAGNOSIS — L723 Sebaceous cyst: Secondary | ICD-10-CM

## 2017-01-29 NOTE — Assessment & Plan Note (Signed)
Doing well post sebaceous cyst excision, sutures removed. Slight about a foreign body reaction to the sutures which were removed today, Steri-Strips applied, return as needed.

## 2017-01-29 NOTE — Progress Notes (Signed)
  Subjective: 20 days post sebaceous cyst removal, doing well.   Objective: General: Well-developed, well-nourished, and in no acute distress. Incision: Clean, dry, intact, sutures removed, Steri-Strips applied. There is a touch of erythema around the edge consistent with foreign body reaction to the sutures rather than infection.  Assessment/plan:   Sebaceous cyst Doing well post sebaceous cyst excision, sutures removed. Slight about a foreign body reaction to the sutures which were removed today, Steri-Strips applied, return as needed.

## 2017-04-09 ENCOUNTER — Ambulatory Visit (INDEPENDENT_AMBULATORY_CARE_PROVIDER_SITE_OTHER): Payer: BLUE CROSS/BLUE SHIELD | Admitting: Sports Medicine

## 2017-04-09 ENCOUNTER — Encounter: Payer: Self-pay | Admitting: Sports Medicine

## 2017-04-09 VITALS — BP 117/73 | HR 73 | Ht 71.5 in | Wt 214.3 lb

## 2017-04-09 DIAGNOSIS — Z0000 Encounter for general adult medical examination without abnormal findings: Secondary | ICD-10-CM | POA: Diagnosis not present

## 2017-04-09 DIAGNOSIS — M7711 Lateral epicondylitis, right elbow: Secondary | ICD-10-CM

## 2017-04-09 MED ORDER — CIPROFLOXACIN HCL 500 MG PO TABS: 500.0000 mg | ORAL_TABLET | Freq: Two times a day (BID) | ORAL | 0 refills | Status: DC

## 2017-04-09 MED ORDER — TYPHOID VACCINE PO CPDR: 1.0000 | DELAYED_RELEASE_CAPSULE | ORAL | 0 refills | Status: DC

## 2017-04-09 MED ORDER — MEFLOQUINE HCL 250 MG PO TABS: 250.0000 mg | ORAL_TABLET | ORAL | 0 refills | Status: DC

## 2017-04-09 NOTE — Assessment & Plan Note (Signed)
10 months since last injection, repeat right lateral epicondyle injection as above.

## 2017-04-09 NOTE — Progress Notes (Signed)
  Subjective:    CC: Annual physical exam  HPI:  Jacob Sutton is a healthy 46 year old man with no complaints with the exception of right elbow pain.  He is traveling to [place].  Right elbow: Known tennis elbow, previous injection was almost 1 year ago. Pain is moderate, persistent, localized without radiation. Desires repeat interventional treatment today.  Past medical history:  Negative.  See flowsheet/record as well for more information.  Surgical history: Negative.  See flowsheet/record as well for more information.  Family history: Negative.  See flowsheet/record as well for more information.  Social history: Negative.  See flowsheet/record as well for more information.  Allergies, and medications have been entered into the medical record, reviewed, and no changes needed.    Review of Systems: No headache, visual changes, nausea, vomiting, diarrhea, constipation, dizziness, abdominal pain, skin rash, fevers, chills, night sweats, swollen lymph nodes, weight loss, chest pain, body aches, joint swelling, muscle aches, shortness of breath, mood changes, visual or auditory hallucinations.  Objective:    General: Well Developed, well nourished, and in no acute distress.  Neuro: Alert and oriented x3, extra-ocular muscles intact, sensation grossly intact. Cranial nerves II through XII are intact, motor, sensory, and coordinative functions are all intact. HEENT: Normocephalic, atraumatic, pupils equal round reactive to light, neck supple, no masses, no lymphadenopathy, thyroid nonpalpable. Oropharynx, nasopharynx, external ear canals are unremarkable. Skin: Warm and dry, no rashes noted.  Cardiac: Regular rate and rhythm, no murmurs rubs or gallops.  Respiratory: Clear to auscultation bilaterally. Not using accessory muscles, speaking in full sentences.  Abdominal: Soft, nontender, nondistended, positive bowel sounds, no masses, no organomegaly.  Right Elbow: Unremarkable to inspection. Range  of motion full pronation, supination, flexion, extension. Strength is full to all of the above directions Stable to varus, valgus stress. Negative moving valgus stress test. Tender to palpation at the common extensor tendon origin Ulnar nerve does not sublux. Negative cubital tunnel Tinel's.  Procedure: Real-time Ultrasound Guided Injection of right common extensor tendon origin Device: GE Logiq E  Verbal informed consent obtained.  Time-out conducted.  Noted no overlying erythema, induration, or other signs of local infection.  Skin prepped in a sterile fashion.  Local anesthesia: Topical Ethyl chloride.  With sterile technique and under real time ultrasound guidance:  Using a 25-gauge needle and placed medication both superficial to and deep to the common extensor tendon at the lateral epicondyle using a total of 1 mL Kenalog 40, 1 mL lidocaine, 1 mL bupivacaine Completed without difficulty  Pain immediately resolved suggesting accurate placement of the medication.  Advised to call if fevers/chills, erythema, induration, drainage, or persistent bleeding.  Images permanently stored and available for review in the ultrasound unit.  Impression: Technically successful ultrasound guided injection.  Impression and Recommendations:    The patient was counselled, risk factors were discussed, anticipatory guidance given.  Annual physical exam Unremarkable physical, ordering routine blood work. He will be traveling to [place] first of next month, adding Cipro for traveler's diarrhea, typhoid and malaria prophylaxis are also recommended.  Lateral epicondylitis of right elbow 10 months since last injection, repeat right lateral epicondyle injection as above.

## 2017-04-09 NOTE — Assessment & Plan Note (Signed)
Unremarkable physical, ordering routine blood work. He will be traveling to [place] first of next month, adding Cipro for traveler's diarrhea, typhoid and malaria prophylaxis are also recommended.

## 2017-04-10 LAB — CBC
HCT: 41.4 % (ref 38.5–50.0)
Hemoglobin: 14 g/dL (ref 13.2–17.1)
MCH: 29.2 pg (ref 27.0–33.0)
MCHC: 33.8 g/dL (ref 32.0–36.0)
MCV: 86.3 fL (ref 80.0–100.0)
MPV: 9.7 fL (ref 7.5–12.5)
Platelets: 245 10*3/uL (ref 140–400)
RBC: 4.8 MIL/uL (ref 4.20–5.80)
RDW: 14 % (ref 11.0–15.0)
WBC: 11.3 K/uL — ABNORMAL HIGH (ref 3.8–10.8)

## 2017-04-11 LAB — LIPID PANEL W/REFLEX DIRECT LDL
Cholesterol: 217 mg/dL — ABNORMAL HIGH (ref <200)
HDL: 59 mg/dL (ref >40)
LDL-Cholesterol: 142 mg/dL — ABNORMAL HIGH
Non-HDL Cholesterol (Calc): 158 mg/dL — ABNORMAL HIGH (ref <130)
Total CHOL/HDL Ratio: 3.7 Ratio (ref <5.0)
Triglycerides: 64 mg/dL (ref <150)

## 2017-04-11 LAB — HEMOGLOBIN A1C
Hgb A1c MFr Bld: 5.3 % (ref <5.7)
Mean Plasma Glucose: 105 mg/dL

## 2017-04-11 LAB — COMPREHENSIVE METABOLIC PANEL
Albumin: 4.3 g/dL (ref 3.6–5.1)
CO2: 20 mmol/L (ref 20–31)
Chloride: 106 mmol/L (ref 98–110)
Creat: 0.97 mg/dL (ref 0.60–1.35)
Potassium: 5.1 mmol/L (ref 3.5–5.3)
Sodium: 138 mmol/L (ref 135–146)

## 2017-04-11 LAB — VITAMIN D 25 HYDROXY (VIT D DEFICIENCY, FRACTURES): Vit D, 25-Hydroxy: 37 ng/mL (ref 30–100)

## 2017-04-11 LAB — COMPREHENSIVE METABOLIC PANEL WITH GFR
ALT: 20 U/L (ref 9–46)
AST: 19 U/L (ref 10–40)
Alkaline Phosphatase: 53 U/L (ref 40–115)
BUN: 21 mg/dL (ref 7–25)
Calcium: 9.7 mg/dL (ref 8.6–10.3)
Glucose, Bld: 112 mg/dL — ABNORMAL HIGH (ref 65–99)
Total Bilirubin: 0.5 mg/dL (ref 0.2–1.2)
Total Protein: 7.2 g/dL (ref 6.1–8.1)

## 2017-04-11 LAB — HIV ANTIBODY (ROUTINE TESTING W REFLEX): HIV 1&2 Ab, 4th Generation: NONREACTIVE

## 2017-04-11 LAB — TSH: TSH: 0.6 m[IU]/L (ref 0.40–4.50)

## 2017-11-28 ENCOUNTER — Encounter: Payer: Self-pay | Admitting: Physician Assistant

## 2017-11-28 ENCOUNTER — Ambulatory Visit: Payer: BLUE CROSS/BLUE SHIELD | Admitting: Physician Assistant

## 2017-11-28 VITALS — BP 130/86 | HR 98 | Temp 98.8°F | Ht 71.5 in | Wt 222.0 lb

## 2017-11-28 DIAGNOSIS — J329 Chronic sinusitis, unspecified: Secondary | ICD-10-CM

## 2017-11-28 DIAGNOSIS — J40 Bronchitis, not specified as acute or chronic: Secondary | ICD-10-CM | POA: Diagnosis not present

## 2017-11-28 MED ORDER — IPRATROPIUM BROMIDE 0.06 % NA SOLN: 2.0000 | Freq: Four times a day (QID) | NASAL | 1 refills | Status: DC

## 2017-11-28 MED ORDER — AMOXICILLIN-POT CLAVULANATE 875-125 MG PO TABS: 1.0000 | ORAL_TABLET | Freq: Two times a day (BID) | ORAL | 0 refills | Status: DC

## 2017-11-28 NOTE — Patient Instructions (Signed)
Upper Respiratory Infection, Adult Most upper respiratory infections (URIs) are caused by a virus. A URI affects the nose, throat, and upper air passages. The most common type of URI is often called "the common cold." Follow these instructions at home:  Take medicines only as told by your doctor.  Gargle warm saltwater or take cough drops to comfort your throat as told by your doctor.  Use a warm mist humidifier or inhale steam from a shower to increase air moisture. This may make it easier to breathe.  Drink enough fluid to keep your pee (urine) clear or pale yellow.  Eat soups and other clear broths.  Have a healthy diet.  Rest as needed.  Go back to work when your fever is gone or your doctor says it is okay. ? You may need to stay home longer to avoid giving your URI to others. ? You can also wear a face mask and wash your hands often to prevent spread of the virus.  Use your inhaler more if you have asthma.  Do not use any tobacco products, including cigarettes, chewing tobacco, or electronic cigarettes. If you need help quitting, ask your doctor. Contact a doctor if:  You are getting worse, not better.  Your symptoms are not helped by medicine.  You have chills.  You are getting more short of breath.  You have brown or red mucus.  You have yellow or brown discharge from your nose.  You have pain in your face, especially when you bend forward.  You have a fever.  You have puffy (swollen) neck glands.  You have pain while swallowing.  You have white areas in the back of your throat. Get help right away if:  You have very bad or constant: ? Headache. ? Ear pain. ? Pain in your forehead, behind your eyes, and over your cheekbones (sinus pain). ? Chest pain.  You have long-lasting (chronic) lung disease and any of the following: ? Wheezing. ? Long-lasting cough. ? Coughing up blood. ? A change in your usual mucus.  You have a stiff neck.  You have  changes in your: ? Vision. ? Hearing. ? Thinking. ? Mood. This information is not intended to replace advice given to you by your health care provider. Make sure you discuss any questions you have with your health care provider. Document Released: 04/10/2008 Document Revised: 06/25/2016 Document Reviewed: 01/28/2014 Elsevier Interactive Patient Education  2018 [employer].

## 2017-11-28 NOTE — Progress Notes (Signed)
   Subjective:    Patient ID: Jacob Sutton, male    DOB: 1971/03/17, 47 y.o.   MRN: [id number]  HPI  Pt is a 47 yo male who presents to the clinic with over a week of sinus pressure, ST, hiccups, predictive cough, ear pain. He has been using nyquil and dayquil with some relief. ST is better. Yesterday he had more nasal sputum and more violent coughing spells. He also "feels like running a fever at night". He has not cheked his temperature. No body aches.    .. Active Ambulatory Problems    Diagnosis Date Noted  . Annual physical exam 04/04/2013  . Hearing loss 05/18/2016  . Lateral epicondylitis of right elbow 05/18/2016  . Sebaceous cyst 01/12/2017   Resolved Ambulatory Problems    Diagnosis Date Noted  . Chest pain 11/19/2012  . SOB (shortness of breath) 11/19/2012  . Abnormal cardiovascular stress test 11/19/2012  . Fracture of fifth metacarpal bone of right hand 02/26/2013  . Cerumen impaction 04/14/2013  . Left groin pain 07/27/2014   No Additional Past Medical History      Review of Systems  All other systems reviewed and are negative.      Objective:   Physical Exam  Constitutional: He is oriented to person, place, and time. He appears well-developed and well-nourished.  HENT:  Head: Normocephalic and atraumatic.  Right Ear: External ear normal.  Left Ear: External ear normal.  Mouth/Throat: No oropharyngeal exudate.  TM's clear bilaterally.  oropharyx erythematous with no swollen tonsils or exudate.  Tenderness over nasal bridge.  Bilateral nasal turbinates red a swollen.   Eyes: Conjunctivae are normal. Right eye exhibits no discharge. Left eye exhibits no discharge.  Neck: Normal range of motion. Neck supple.  Cardiovascular: Normal rate, regular rhythm and normal heart sounds.  Pulmonary/Chest: Effort normal and breath sounds normal. He has no wheezes.  Rhonchi cleared after cough in both upper lungs.   Lymphadenopathy:    He has no cervical adenopathy.   Neurological: He is alert and oriented to person, place, and time.  Psychiatric: He has a normal mood and affect. His behavior is normal.          Assessment & Plan:  Marland KitchenMarland KitchenGranite was seen today for cough and nasal congestion.  Diagnoses and all orders for this visit:  Sinobronchitis -     amoxicillin-clavulanate (AUGMENTIN) 875-125 MG tablet; Take 1 tablet by mouth 2 (two) times daily. -     ipratropium (ATROVENT) 0.06 % nasal spray; Place 2 sprays into both nostrils 4 (four) times daily.   Discussed how illness could be viral even though symptoms have last over a week.Since started feeling better today hold on augmentin until another 2-3 days. If continues to have symptoms or they worsen may start. In the mean time. atrovent nasal spray and dayquil can be used. Consider humidifier. Rest and hydrate. Suck on hard candy or cough drops. Follow up if not improving.

## 2017-11-30 ENCOUNTER — Encounter: Payer: Self-pay | Admitting: Physician Assistant

## 2018-07-02 ENCOUNTER — Encounter: Payer: Self-pay | Admitting: Sports Medicine

## 2018-07-02 ENCOUNTER — Ambulatory Visit (INDEPENDENT_AMBULATORY_CARE_PROVIDER_SITE_OTHER): Payer: BLUE CROSS/BLUE SHIELD | Admitting: Sports Medicine

## 2018-07-02 DIAGNOSIS — E782 Mixed hyperlipidemia: Secondary | ICD-10-CM

## 2018-07-02 DIAGNOSIS — Z0000 Encounter for general adult medical examination without abnormal findings: Secondary | ICD-10-CM

## 2018-07-02 DIAGNOSIS — L723 Sebaceous cyst: Secondary | ICD-10-CM | POA: Diagnosis not present

## 2018-07-02 DIAGNOSIS — R7989 Other specified abnormal findings of blood chemistry: Secondary | ICD-10-CM | POA: Diagnosis not present

## 2018-07-02 NOTE — Assessment & Plan Note (Addendum)
Rechecking lipids, adding low-dose atorvastatin if still elevated.  Labs look good except for cholesterol, LDL is 154, per discussion we are going to add atorvastatin 10 mg nightly.  Recheck fasting lipids in 2 months.  Orders placed, no appointment needed.

## 2018-07-02 NOTE — Progress Notes (Addendum)
Subjective:    CC: Annual physical  HPI:  Jacob Sutton is here for his physical, he is doing well, he does have some stress regarding his daughter but otherwise things are okay.  He did note a slight recurrence of feels to be a small sebaceous cyst at the site of a prior sebaceous cyst excision.  I reviewed the past medical history, family history, social history, surgical history, and allergies today and no changes were needed.  Please see the problem list section below in epic for further details.  Past Medical History: No past medical history on file. Past Surgical History: Past Surgical History:  Procedure Laterality Date  . APPENDECTOMY    . CARDIAC CATHETERIZATION  11/06/12   Clean.  Marland Kitchen HERNIA REPAIR    . VASECTOMY     Social History: Social History   Socioeconomic History  . Marital status: Married    Spouse name: Not on file  . Number of children: Not on file  . Years of education: Not on file  . Highest education level: Not on file  Occupational History  . Not on file  Social Needs  . Financial resource strain: Not on file  . Food insecurity:    Worry: Not on file    Inability: Not on file  . Transportation needs:    Medical: Not on file    Non-medical: Not on file  Tobacco Use  . Smoking status: Never Smoker  . Smokeless tobacco: Never Used  Substance and Sexual Activity  . Alcohol use: Yes  . Drug use: No  . Sexual activity: Not on file  Lifestyle  . Physical activity:    Days per week: Not on file    Minutes per session: Not on file  . Stress: Not on file  Relationships  . Social connections:    Talks on phone: Not on file    Gets together: Not on file    Attends religious service: Not on file    Active member of club or organization: Not on file    Attends meetings of clubs or organizations: Not on file    Relationship status: Not on file  Other Topics Concern  . Not on file  Social History Narrative  . Not on file   Family History: Family History   Problem Relation Age of Onset  . Heart disease Mother   . Heart disease Father   . Diabetes Father    Allergies: No Known Allergies Medications: See med rec.  Review of Systems: No headache, visual changes, nausea, vomiting, diarrhea, constipation, dizziness, abdominal pain, skin rash, fevers, chills, night sweats, swollen lymph nodes, weight loss, chest pain, body aches, joint swelling, muscle aches, shortness of breath, mood changes, visual or auditory hallucinations.  Objective:    General: Well Developed, well nourished, and in no acute distress.  Neuro: Alert and oriented x3, extra-ocular muscles intact, sensation grossly intact. Cranial nerves II through XII are intact, motor, sensory, and coordinative functions are all intact. HEENT: Normocephalic, atraumatic, pupils equal round reactive to light, neck supple, no masses, no lymphadenopathy, thyroid nonpalpable. Oropharynx, nasopharynx, external ear canals are unremarkable. Skin: Warm and dry, no rashes noted.  There is a 1 cm sebaceous cyst that feels to be in the superficial subcutaneous tissues at the site of a prior sebaceous cyst excision. Cardiac: Regular rate and rhythm, no murmurs rubs or gallops.  Respiratory: Clear to auscultation bilaterally. Not using accessory muscles, speaking in full sentences.  Abdominal: Soft, nontender, nondistended, positive bowel  sounds, no masses, no organomegaly.  Musculoskeletal: Shoulder, elbow, wrist, hip, knee, ankle stable, and with full range of motion.  Impression and Recommendations:    The patient was counselled, risk factors were discussed, anticipatory guidance given.  Annual physical exam Annual physical as above.   Checking routine labs.  Sebaceous cyst There is another sebaceous cyst at the site of the prior scar. He will return for a 30-minute reexcision, there does not appear to be any bacterial infection so this would be a good time to remove it.  Hyperlipidemia,  mixed Rechecking lipids, adding low-dose atorvastatin if still elevated.  Labs look good except for cholesterol, LDL is 154, per discussion we are going to add atorvastatin 10 mg nightly.  Recheck fasting lipids in 2 months.  Orders placed, no appointment needed. ___________________________________________ Gwen Her. Dianah Field, M.D., ABFM., CAQSM. Primary Care and [place] Instructor of [place] of [facility] of Medicine

## 2018-07-02 NOTE — Assessment & Plan Note (Signed)
Annual physical as above.   Checking routine labs.

## 2018-07-02 NOTE — Assessment & Plan Note (Addendum)
There is another sebaceous cyst at the site of the prior scar. He will return for a 30-minute reexcision, there does not appear to be any bacterial infection so this would be a good time to remove it.

## 2018-07-03 LAB — COMPREHENSIVE METABOLIC PANEL
Albumin: 4.7 g/dL (ref 3.6–5.1)
Alkaline phosphatase (APISO): 70 U/L (ref 40–115)
BUN: 16 mg/dL (ref 7–25)
Chloride: 102 mmol/L (ref 98–110)
Creat: 1.01 mg/dL (ref 0.60–1.35)
Globulin: 2.9 g/dL (calc) (ref 1.9–3.7)
Glucose, Bld: 89 mg/dL (ref 65–99)
Potassium: 5 mmol/L (ref 3.5–5.3)
Sodium: 138 mmol/L (ref 135–146)
Total Protein: 7.6 g/dL (ref 6.1–8.1)

## 2018-07-03 LAB — LIPID PANEL W/REFLEX DIRECT LDL
Cholesterol: 231 mg/dL — ABNORMAL HIGH (ref <200)
HDL: 55 mg/dL (ref >40)
LDL Cholesterol (Calc): 154 mg/dL (calc) — ABNORMAL HIGH
Non-HDL Cholesterol (Calc): 176 mg/dL (calc) — ABNORMAL HIGH (ref <130)
Total CHOL/HDL Ratio: 4.2 (calc) (ref <5.0)
Triglycerides: 104 mg/dL (ref <150)

## 2018-07-03 LAB — CBC
HCT: 42.7 % (ref 38.5–50.0)
Hemoglobin: 14.7 g/dL (ref 13.2–17.1)
MCH: 29.8 pg (ref 27.0–33.0)
MCHC: 34.4 g/dL (ref 32.0–36.0)
MCV: 86.4 fL (ref 80.0–100.0)
MPV: 10.1 fL (ref 7.5–12.5)
Platelets: 234 Thousand/uL (ref 140–400)
RBC: 4.94 10*6/uL (ref 4.20–5.80)
RDW: 13.5 % (ref 11.0–15.0)
WBC: 6.2 10*3/uL (ref 3.8–10.8)

## 2018-07-03 LAB — HEMOGLOBIN A1C
Hgb A1c MFr Bld: 5.2 % of total Hgb (ref <5.7)
Mean Plasma Glucose: 103 (calc)
eAG (mmol/L): 5.7 (calc)

## 2018-07-03 LAB — COMPREHENSIVE METABOLIC PANEL WITH GFR
AG Ratio: 1.6 (calc) (ref 1.0–2.5)
ALT: 28 U/L (ref 9–46)
AST: 24 U/L (ref 10–40)
CO2: 29 mmol/L (ref 20–32)
Calcium: 10.1 mg/dL (ref 8.6–10.3)
Total Bilirubin: 0.6 mg/dL (ref 0.2–1.2)

## 2018-07-03 LAB — TSH: TSH: 2.03 mIU/L (ref 0.40–4.50)

## 2018-07-03 LAB — VITAMIN D 25 HYDROXY (VIT D DEFICIENCY, FRACTURES): Vit D, 25-Hydroxy: 42 ng/mL (ref 30–100)

## 2018-07-03 MED ORDER — ATORVASTATIN CALCIUM 10 MG PO TABS: 10.0000 mg | ORAL_TABLET | Freq: Every day | ORAL | 3 refills | Status: DC

## 2018-07-03 NOTE — Addendum Note (Signed)
Addended by: Silverio Decamp on: 07/03/2018 08:23 AM   Modules accepted: Orders

## 2018-07-04 ENCOUNTER — Encounter: Payer: Self-pay | Admitting: Sports Medicine

## 2018-07-04 ENCOUNTER — Ambulatory Visit: Payer: BLUE CROSS/BLUE SHIELD | Admitting: Sports Medicine

## 2018-07-04 DIAGNOSIS — L723 Sebaceous cyst: Secondary | ICD-10-CM

## 2018-07-04 NOTE — Progress Notes (Signed)
   Procedure:  Excision of 1.5 cm sebaceous cyst, right upper shoulder posterior Risks, benefits, and alternatives explained and consent obtained. Time out conducted. Surface prepped with alcohol. 5cc lidocaine with epinephine infiltrated in a field block. Adequate anesthesia ensured. Area prepped and draped in a sterile fashion. Excision performed with: Using a #15 blade and made an elliptical incision, then using both sharp and blunt dissection I carried the procedure around the cyst and removed it en bloc, I then closed the incision with #5 3-0 simple interrupted Ethilon sutures. Hemostasis achieved. Pt stable.

## 2018-07-04 NOTE — Assessment & Plan Note (Addendum)
New sebaceous cyst at the scar of her previous sebaceous cyst excision. This was removed en-bloc. Return in 10 days for suture removal.

## 2018-07-15 ENCOUNTER — Ambulatory Visit (INDEPENDENT_AMBULATORY_CARE_PROVIDER_SITE_OTHER): Payer: BLUE CROSS/BLUE SHIELD | Admitting: Sports Medicine

## 2018-07-15 ENCOUNTER — Encounter: Payer: Self-pay | Admitting: Sports Medicine

## 2018-07-15 DIAGNOSIS — L723 Sebaceous cyst: Secondary | ICD-10-CM

## 2018-07-15 NOTE — Assessment & Plan Note (Signed)
Days post sebaceous cyst excision, right upper posterior shoulder, doing extremely well. Sutures removed today, return as needed.

## 2018-07-15 NOTE — Progress Notes (Signed)
  Subjective: 10 days post sebaceous cyst excision, doing extremely well.  Objective: General: Well-developed, well-nourished, and in no acute distress. Right upper posterior shoulder: Incision is clean, dry, intact, sutures removed today.  Assessment/plan:   Sebaceous cyst Days post sebaceous cyst excision, right upper posterior shoulder, doing extremely well. Sutures removed today, return as needed. ___________________________________________ Gwen Her. Dianah Field, M.D., ABFM., CAQSM. Primary Care and [place] Instructor of [place] of [facility] of Medicine

## 2019-01-12 DIAGNOSIS — R05 Cough: Secondary | ICD-10-CM | POA: Diagnosis not present

## 2019-01-12 DIAGNOSIS — R030 Elevated blood-pressure reading, without diagnosis of hypertension: Secondary | ICD-10-CM | POA: Diagnosis not present

## 2019-01-12 DIAGNOSIS — J069 Acute upper respiratory infection, unspecified: Secondary | ICD-10-CM | POA: Diagnosis not present

## 2019-08-09 ENCOUNTER — Other Ambulatory Visit: Payer: Self-pay | Admitting: Sports Medicine

## 2019-08-09 DIAGNOSIS — E782 Mixed hyperlipidemia: Secondary | ICD-10-CM

## 2019-08-14 ENCOUNTER — Encounter: Payer: Self-pay | Admitting: Sports Medicine

## 2019-08-14 ENCOUNTER — Other Ambulatory Visit: Payer: Self-pay

## 2019-08-14 ENCOUNTER — Ambulatory Visit (INDEPENDENT_AMBULATORY_CARE_PROVIDER_SITE_OTHER): Payer: BC Managed Care – PPO | Admitting: Sports Medicine

## 2019-08-14 DIAGNOSIS — F909 Attention-deficit hyperactivity disorder, unspecified type: Secondary | ICD-10-CM | POA: Diagnosis not present

## 2019-08-14 DIAGNOSIS — Z0000 Encounter for general adult medical examination without abnormal findings: Secondary | ICD-10-CM

## 2019-08-14 DIAGNOSIS — R7989 Other specified abnormal findings of blood chemistry: Secondary | ICD-10-CM | POA: Diagnosis not present

## 2019-08-14 DIAGNOSIS — E782 Mixed hyperlipidemia: Secondary | ICD-10-CM | POA: Diagnosis not present

## 2019-08-14 MED ORDER — AMPHETAMINE-DEXTROAMPHET ER 10 MG PO CP24: 10.0000 mg | ORAL_CAPSULE | Freq: Every day | ORAL | 0 refills | Status: DC

## 2019-08-14 NOTE — Progress Notes (Signed)
Subjective:    CC: Annual physical  HPI:  This is a pleasant 48 year old male, here for his physical, his only complaint is difficulty concentrating, he denies any other manic or hypomanic symptoms, no symptoms of depression, he has struggled with this since childhood.  I reviewed the past medical history, family history, social history, surgical history, and allergies today and no changes were needed.  Please see the problem list section below in epic for further details.  Past Medical History: No past medical history on file. Past Surgical History: Past Surgical History:  Procedure Laterality Date  . APPENDECTOMY    . CARDIAC CATHETERIZATION  11/06/12   Clean.  Marland Kitchen HERNIA REPAIR    . VASECTOMY     Social History: Social History   Socioeconomic History  . Marital status: Married    Spouse name: Not on file  . Number of children: Not on file  . Years of education: Not on file  . Highest education level: Not on file  Occupational History  . Not on file  Social Needs  . Financial resource strain: Not on file  . Food insecurity    Worry: Not on file    Inability: Not on file  . Transportation needs    Medical: Not on file    Non-medical: Not on file  Tobacco Use  . Smoking status: Never Smoker  . Smokeless tobacco: Never Used  Substance and Sexual Activity  . Alcohol use: Yes  . Drug use: No  . Sexual activity: Not on file  Lifestyle  . Physical activity    Days per week: Not on file    Minutes per session: Not on file  . Stress: Not on file  Relationships  . Social Herbalist on phone: Not on file    Gets together: Not on file    Attends religious service: Not on file    Active member of club or organization: Not on file    Attends meetings of clubs or organizations: Not on file    Relationship status: Not on file  Other Topics Concern  . Not on file  Social History Narrative  . Not on file   Family History: Family History  Problem Relation Age  of Onset  . Heart disease Mother   . Heart disease Father   . Diabetes Father    Allergies: No Known Allergies Medications: See med rec.  Review of Systems: No headache, visual changes, nausea, vomiting, diarrhea, constipation, dizziness, abdominal pain, skin rash, fevers, chills, night sweats, swollen lymph nodes, weight loss, chest pain, body aches, joint swelling, muscle aches, shortness of breath, mood changes, visual or auditory hallucinations.  Objective:    General: Well Developed, well nourished, and in no acute distress.  Neuro: Alert and oriented x3, extra-ocular muscles intact, sensation grossly intact. Cranial nerves II through XII are intact, motor, sensory, and coordinative functions are all intact. HEENT: Normocephalic, atraumatic, pupils equal round reactive to light, neck supple, no masses, no lymphadenopathy, thyroid nonpalpable. Oropharynx, nasopharynx, external ear canals are unremarkable. Skin: Warm and dry, no rashes noted.  Cardiac: Regular rate and rhythm, no murmurs rubs or gallops.  Respiratory: Clear to auscultation bilaterally. Not using accessory muscles, speaking in full sentences.  Abdominal: Soft, nontender, nondistended, positive bowel sounds, no masses, no organomegaly.  Musculoskeletal: Shoulder, elbow, wrist, hip, knee, ankle stable, and with full range of motion.  Impression and Recommendations:    The patient was counselled, risk factors were discussed, anticipatory guidance  given.  Annual physical exam Routine physical as above, checking labs.   Adult ADHD Symptoms do sound like adult ADHD. He really does not have any depressive symptoms, and no cyclic symptoms to suggest bipolar 2 disorder. Adding low-dose Adderall, follow-up in 1 month to reevaluate.   ___________________________________________ Gwen Her. Dianah Field, M.D., ABFM., CAQSM. Primary Care and Sports Medicine [place] [facility]  Adjunct Professor of [place] of [facility] [employer] of Medicine

## 2019-08-14 NOTE — Assessment & Plan Note (Signed)
Routine physical as above, checking labs.

## 2019-08-14 NOTE — Assessment & Plan Note (Signed)
Symptoms do sound like adult ADHD. He really does not have any depressive symptoms, and no cyclic symptoms to suggest bipolar 2 disorder. Adding low-dose Adderall, follow-up in 1 month to reevaluate.

## 2019-08-15 LAB — LIPID PANEL W/REFLEX DIRECT LDL
Cholesterol: 171 mg/dL (ref <200)
HDL: 59 mg/dL (ref >=40)
LDL Cholesterol (Calc): 95 mg/dL (calc)
Non-HDL Cholesterol (Calc): 112 mg/dL (calc) (ref <130)
Total CHOL/HDL Ratio: 2.9 (calc) (ref <5.0)
Triglycerides: 76 mg/dL (ref <150)

## 2019-08-15 LAB — COMPLETE METABOLIC PANEL WITH GFR
AG Ratio: 1.6 (calc) (ref 1.0–2.5)
ALT: 19 U/L (ref 9–46)
AST: 20 U/L (ref 10–40)
Albumin: 4.6 g/dL (ref 3.6–5.1)
Alkaline phosphatase (APISO): 74 U/L (ref 36–130)
BUN: 15 mg/dL (ref 7–25)
CO2: 28 mmol/L (ref 20–32)
Calcium: 10 mg/dL (ref 8.6–10.3)
Chloride: 103 mmol/L (ref 98–110)
Creat: 0.96 mg/dL (ref 0.60–1.35)
GFR, Est African American: 108 mL/min/{1.73_m2} (ref >=60)
GFR, Est Non African American: 93 mL/min/{1.73_m2} (ref >=60)
Globulin: 2.9 g/dL (calc) (ref 1.9–3.7)
Glucose, Bld: 101 mg/dL — ABNORMAL HIGH (ref 65–99)
Potassium: 5.1 mmol/L (ref 3.5–5.3)
Sodium: 138 mmol/L (ref 135–146)
Total Bilirubin: 0.6 mg/dL (ref 0.2–1.2)
Total Protein: 7.5 g/dL (ref 6.1–8.1)

## 2019-08-15 LAB — CBC
HCT: 44.5 % (ref 38.5–50.0)
Hemoglobin: 15.1 g/dL (ref 13.2–17.1)
MCH: 29.9 pg (ref 27.0–33.0)
MCHC: 33.9 g/dL (ref 32.0–36.0)
MCV: 88.1 fL (ref 80.0–100.0)
MPV: 10.1 fL (ref 7.5–12.5)
Platelets: 249 10*3/uL (ref 140–400)
RBC: 5.05 10*6/uL (ref 4.20–5.80)
RDW: 13.2 % (ref 11.0–15.0)
WBC: 5.2 10*3/uL (ref 3.8–10.8)

## 2019-08-15 LAB — TSH: TSH: 1.7 mIU/L (ref 0.40–4.50)

## 2019-08-15 LAB — VITAMIN D 25 HYDROXY (VIT D DEFICIENCY, FRACTURES): Vit D, 25-Hydroxy: 37 ng/mL (ref 30–100)

## 2019-08-21 ENCOUNTER — Other Ambulatory Visit: Payer: Self-pay | Admitting: Sports Medicine

## 2019-08-21 DIAGNOSIS — E782 Mixed hyperlipidemia: Secondary | ICD-10-CM

## 2019-08-28 ENCOUNTER — Telehealth: Payer: Self-pay | Admitting: *Deleted

## 2019-08-28 DIAGNOSIS — F909 Attention-deficit hyperactivity disorder, unspecified type: Secondary | ICD-10-CM

## 2019-08-28 MED ORDER — AMPHETAMINE-DEXTROAMPHET ER 30 MG PO CP24: 30.0000 mg | ORAL_CAPSULE | Freq: Every day | ORAL | 0 refills | Status: DC

## 2019-08-28 NOTE — Telephone Encounter (Signed)
Pt left vm stating that after 5 days of doing the 63m of Adderall he felt no change at all.  He doubled up to 296mand felt a small change & has now started doing 3024m He stated that this is the dose that is working for him.  Do you need him to do a virtual visit for all of this or can you just send him in a new rx for the 60m67mlease advise.

## 2019-08-28 NOTE — Telephone Encounter (Signed)
Pt notified of rx.

## 2019-08-28 NOTE — Telephone Encounter (Signed)
Happy to just send some 16s.

## 2019-09-12 ENCOUNTER — Encounter: Payer: Self-pay | Admitting: Sports Medicine

## 2019-09-12 ENCOUNTER — Ambulatory Visit (INDEPENDENT_AMBULATORY_CARE_PROVIDER_SITE_OTHER): Payer: BC Managed Care – PPO | Admitting: Sports Medicine

## 2019-09-12 ENCOUNTER — Other Ambulatory Visit: Payer: Self-pay

## 2019-09-12 DIAGNOSIS — F909 Attention-deficit hyperactivity disorder, unspecified type: Secondary | ICD-10-CM

## 2019-09-12 MED ORDER — AMPHETAMINE-DEXTROAMPHET ER 30 MG PO CP24: 30.0000 mg | ORAL_CAPSULE | Freq: Every day | ORAL | 0 refills | Status: DC

## 2019-09-12 NOTE — Assessment & Plan Note (Signed)
Very well controlled on Adderall 30, refilling monthly. I can see him in a year for this.

## 2019-09-12 NOTE — Progress Notes (Signed)
  Subjective:    CC: Follow-up  HPI: Adult ADHD: Stable now on Adderall 30 XR.  I reviewed the past medical history, family history, social history, surgical history, and allergies today and no changes were needed.  Please see the problem list section below in epic for further details.  Past Medical History: No past medical history on file. Past Surgical History: Past Surgical History:  Procedure Laterality Date  . APPENDECTOMY    . CARDIAC CATHETERIZATION  11/06/12   Clean.  Marland Kitchen HERNIA REPAIR    . VASECTOMY     Social History: Social History   Socioeconomic History  . Marital status: Married    Spouse name: Not on file  . Number of children: Not on file  . Years of education: Not on file  . Highest education level: Not on file  Occupational History  . Not on file  Social Needs  . Financial resource strain: Not on file  . Food insecurity    Worry: Not on file    Inability: Not on file  . Transportation needs    Medical: Not on file    Non-medical: Not on file  Tobacco Use  . Smoking status: Never Smoker  . Smokeless tobacco: Never Used  Substance and Sexual Activity  . Alcohol use: Yes  . Drug use: No  . Sexual activity: Not on file  Lifestyle  . Physical activity    Days per week: Not on file    Minutes per session: Not on file  . Stress: Not on file  Relationships  . Social Herbalist on phone: Not on file    Gets together: Not on file    Attends religious service: Not on file    Active member of club or organization: Not on file    Attends meetings of clubs or organizations: Not on file    Relationship status: Not on file  Other Topics Concern  . Not on file  Social History Narrative  . Not on file   Family History: Family History  Problem Relation Age of Onset  . Heart disease Mother   . Heart disease Father   . Diabetes Father    Allergies: No Known Allergies Medications: See med rec.  Review of Systems: No fevers, chills, night  sweats, weight loss, chest pain, or shortness of breath.   Objective:    General: Well Developed, well nourished, and in no acute distress.  Neuro: Alert and oriented x3, extra-ocular muscles intact, sensation grossly intact.  HEENT: Normocephalic, atraumatic, pupils equal round reactive to light, neck supple, no masses, no lymphadenopathy, thyroid nonpalpable.  Skin: Warm and dry, no rashes. Cardiac: Regular rate and rhythm, no murmurs rubs or gallops, no lower extremity edema.  Respiratory: Clear to auscultation bilaterally. Not using accessory muscles, speaking in full sentences.  Impression and Recommendations:    Adult ADHD Very well controlled on Adderall 30, refilling monthly. I can see him in a year for this.  I spent 25 minutes with this patient, greater than 50% was face-to-face time counseling regarding the above diagnoses.  ___________________________________________ Gwen Her. Dianah Field, M.D., ABFM., CAQSM. Primary Care and Sports Medicine [place] [facility]  Adjunct Professor of [place] of [facility] of Medicine

## 2019-09-18 ENCOUNTER — Encounter: Payer: Self-pay | Admitting: Sports Medicine

## 2019-09-25 ENCOUNTER — Other Ambulatory Visit: Payer: Self-pay | Admitting: Sports Medicine

## 2019-09-25 DIAGNOSIS — F909 Attention-deficit hyperactivity disorder, unspecified type: Secondary | ICD-10-CM

## 2019-10-07 ENCOUNTER — Encounter: Payer: Self-pay | Admitting: Sports Medicine

## 2019-10-08 ENCOUNTER — Other Ambulatory Visit: Payer: Self-pay | Admitting: Sports Medicine

## 2019-10-08 DIAGNOSIS — F909 Attention-deficit hyperactivity disorder, unspecified type: Secondary | ICD-10-CM

## 2019-10-08 MED ORDER — AMPHETAMINE-DEXTROAMPHET ER 30 MG PO CP24: 30.0000 mg | ORAL_CAPSULE | Freq: Every day | ORAL | 0 refills | Status: DC

## 2019-11-17 ENCOUNTER — Other Ambulatory Visit: Payer: Self-pay

## 2019-11-17 DIAGNOSIS — F909 Attention-deficit hyperactivity disorder, unspecified type: Secondary | ICD-10-CM

## 2019-11-18 MED ORDER — AMPHETAMINE-DEXTROAMPHET ER 30 MG PO CP24: 30.0000 mg | ORAL_CAPSULE | Freq: Every day | ORAL | 0 refills | Status: DC

## 2020-01-02 ENCOUNTER — Other Ambulatory Visit: Payer: Self-pay

## 2020-01-02 ENCOUNTER — Encounter: Payer: Self-pay | Admitting: Sports Medicine

## 2020-01-02 ENCOUNTER — Ambulatory Visit (INDEPENDENT_AMBULATORY_CARE_PROVIDER_SITE_OTHER): Payer: BC Managed Care – PPO | Admitting: Sports Medicine

## 2020-01-02 DIAGNOSIS — F909 Attention-deficit hyperactivity disorder, unspecified type: Secondary | ICD-10-CM

## 2020-01-02 MED ORDER — AMPHETAMINE-DEXTROAMPHET ER 25 MG PO CP24: 25.0000 mg | ORAL_CAPSULE | Freq: Every day | ORAL | 0 refills | Status: DC

## 2020-01-02 NOTE — Assessment & Plan Note (Signed)
Jacob Sutton returns, he is a pleasant 49 year old male with adult ADHD. 10 mg of Adderall XL was insufficient, he bumped up to 20 mg which was also insufficient. We then bumped up to thirty, it does have good efficacy but he is having trouble sleeping. We are going to drop down to 25 mg daily, touch base again in a month and if he still has difficulty sleeping we will discuss adding medication to help him sleep.

## 2020-01-02 NOTE — Addendum Note (Signed)
Addended by: Silverio Decamp on: 01/02/2020 03:02 PM   Modules accepted: Orders

## 2020-01-02 NOTE — Progress Notes (Signed)
    Procedures performed today:    None.  Independent interpretation of tests performed by another provider:   None.  Impression and Recommendations:    Adult ADHD Jacob Sutton returns, he is a pleasant 49 year old male with adult ADHD. 10 mg of Adderall XL was insufficient, he bumped up to 20 mg which was also insufficient. We then bumped up to thirty, it does have good efficacy but he is having trouble sleeping. We are going to drop down to 25 mg daily, touch base again in a month and if he still has difficulty sleeping we will discuss adding medication to help him sleep.    ___________________________________________ Gwen Her. Dianah Field, M.D., ABFM., CAQSM. Primary Care and [place] Instructor of [place] of [facility] of Medicine

## 2020-01-20 DIAGNOSIS — F909 Attention-deficit hyperactivity disorder, unspecified type: Secondary | ICD-10-CM

## 2020-01-20 MED ORDER — AMPHETAMINE-DEXTROAMPHET ER 30 MG PO CP24: 30.0000 mg | ORAL_CAPSULE | Freq: Every day | ORAL | 0 refills | Status: DC

## 2020-01-20 NOTE — Assessment & Plan Note (Signed)
Jacob Sutton returns, we initially decreased his 30 mg Adderall XL down to 25 mg due to difficulty sleeping, he now reports insufficient efficacy with his concentration. Going back up to 30 mg daily and he will likely just have to do a Benadryl at night to help him sleep.

## 2020-02-14 ENCOUNTER — Other Ambulatory Visit: Payer: Self-pay

## 2020-02-14 DIAGNOSIS — F909 Attention-deficit hyperactivity disorder, unspecified type: Secondary | ICD-10-CM

## 2020-02-16 MED ORDER — AMPHETAMINE-DEXTROAMPHET ER 30 MG PO CP24: 30.0000 mg | ORAL_CAPSULE | Freq: Every day | ORAL | 0 refills | Status: DC

## 2020-03-04 DIAGNOSIS — Z20822 Contact with and (suspected) exposure to covid-19: Secondary | ICD-10-CM | POA: Diagnosis not present

## 2020-04-12 ENCOUNTER — Other Ambulatory Visit: Payer: Self-pay

## 2020-04-12 DIAGNOSIS — F909 Attention-deficit hyperactivity disorder, unspecified type: Secondary | ICD-10-CM

## 2020-04-12 MED ORDER — AMPHETAMINE-DEXTROAMPHET ER 30 MG PO CP24: 30.0000 mg | ORAL_CAPSULE | Freq: Every day | ORAL | 0 refills | Status: DC

## 2020-05-18 ENCOUNTER — Other Ambulatory Visit: Payer: Self-pay | Admitting: Sports Medicine

## 2020-05-18 ENCOUNTER — Other Ambulatory Visit: Payer: Self-pay

## 2020-05-18 DIAGNOSIS — F909 Attention-deficit hyperactivity disorder, unspecified type: Secondary | ICD-10-CM

## 2020-05-19 MED ORDER — AMPHETAMINE-DEXTROAMPHET ER 30 MG PO CP24: 30.0000 mg | ORAL_CAPSULE | Freq: Every day | ORAL | 0 refills | Status: DC

## 2020-06-23 ENCOUNTER — Other Ambulatory Visit: Payer: Self-pay

## 2020-06-23 DIAGNOSIS — F909 Attention-deficit hyperactivity disorder, unspecified type: Secondary | ICD-10-CM

## 2020-06-24 MED ORDER — AMPHETAMINE-DEXTROAMPHET ER 30 MG PO CP24: 30.0000 mg | ORAL_CAPSULE | Freq: Every day | ORAL | 0 refills | Status: DC

## 2020-07-20 ENCOUNTER — Encounter: Payer: Self-pay | Admitting: Sports Medicine

## 2020-07-20 ENCOUNTER — Ambulatory Visit (INDEPENDENT_AMBULATORY_CARE_PROVIDER_SITE_OTHER): Payer: BC Managed Care – PPO | Admitting: Sports Medicine

## 2020-07-20 VITALS — BP 137/79 | HR 70 | Ht 71.5 in | Wt 204.0 lb

## 2020-07-20 DIAGNOSIS — Z6790 Unspecified blood type, Rh positive: Secondary | ICD-10-CM | POA: Diagnosis not present

## 2020-07-20 DIAGNOSIS — Z20822 Contact with and (suspected) exposure to covid-19: Secondary | ICD-10-CM | POA: Diagnosis not present

## 2020-07-20 DIAGNOSIS — Z0000 Encounter for general adult medical examination without abnormal findings: Secondary | ICD-10-CM

## 2020-07-20 DIAGNOSIS — E782 Mixed hyperlipidemia: Secondary | ICD-10-CM

## 2020-07-20 DIAGNOSIS — Z1211 Encounter for screening for malignant neoplasm of colon: Secondary | ICD-10-CM

## 2020-07-20 NOTE — Progress Notes (Signed)
Subjective:    CC: Annual Physical Exam  HPI:  This patient is here for their annual physical  I reviewed the past medical history, family history, social history, surgical history, and allergies today and no changes were needed.  Please see the problem list section below in epic for further details.  Past Medical History: No past medical history on file. Past Surgical History: Past Surgical History:  Procedure Laterality Date  . APPENDECTOMY    . CARDIAC CATHETERIZATION  11/06/12   Clean.  Marland Kitchen HERNIA REPAIR    . VASECTOMY     Social History: Social History   Socioeconomic History  . Marital status: Married    Spouse name: Not on file  . Number of children: Not on file  . Years of education: Not on file  . Highest education level: Not on file  Occupational History  . Not on file  Tobacco Use  . Smoking status: Never Smoker  . Smokeless tobacco: Never Used  Substance and Sexual Activity  . Alcohol use: Yes  . Drug use: No  . Sexual activity: Not on file  Other Topics Concern  . Not on file  Social History Narrative  . Not on file   Social Determinants of Health   Financial Resource Strain:   . Difficulty of Paying Living Expenses: Not on file  Food Insecurity:   . Worried About Charity fundraiser in the Last Year: Not on file  . Ran Out of Food in the Last Year: Not on file  Transportation Needs:   . Lack of Transportation (Medical): Not on file  . Lack of Transportation (Non-Medical): Not on file  Physical Activity:   . Days of Exercise per Week: Not on file  . Minutes of Exercise per Session: Not on file  Stress:   . Feeling of Stress : Not on file  Social Connections:   . Frequency of Communication with Friends and Family: Not on file  . Frequency of Social Gatherings with Friends and Family: Not on file  . Attends Religious Services: Not on file  . Active Member of Clubs or Organizations: Not on file  . Attends Archivist Meetings: Not on  file  . Marital Status: Not on file   Family History: Family History  Problem Relation Age of Onset  . Heart disease Mother   . Heart disease Father   . Diabetes Father    Allergies: No Known Allergies Medications: See med rec.  Review of Systems: No headache, visual changes, nausea, vomiting, diarrhea, constipation, dizziness, abdominal pain, skin rash, fevers, chills, night sweats, swollen lymph nodes, weight loss, chest pain, body aches, joint swelling, muscle aches, shortness of breath, mood changes, visual or auditory hallucinations.  Objective:    General: Well Developed, well nourished, and in no acute distress.  Neuro: Alert and oriented x3, extra-ocular muscles intact, sensation grossly intact. Cranial nerves II through XII are intact, motor, sensory, and coordinative functions are all intact. HEENT: Normocephalic, atraumatic, pupils equal round reactive to light, neck supple, no masses, no lymphadenopathy, thyroid nonpalpable. Oropharynx, nasopharynx, external ear canals are unremarkable. Skin: Warm and dry, no rashes noted.  Cardiac: Regular rate and rhythm, no murmurs rubs or gallops.  Respiratory: Clear to auscultation bilaterally. Not using accessory muscles, speaking in full sentences.  Abdominal: Soft, nontender, nondistended, positive bowel sounds, no masses, no organomegaly.  Musculoskeletal: Shoulder, elbow, wrist, hip, knee, ankle stable, and with full range of motion.  Impression and Recommendations:  The patient was counselled, risk factors were discussed, anticipatory guidance given.  Annual physical exam Annual physical as above. Checking routine labs, we are going to add Cologuard screening, recommendation is 45 and up. He would also like blood typing and Covid antibodies. He does plan to get his Covid vaccine coming up later this week.   ___________________________________________ Gwen Her. Dianah Field, M.D., ABFM., CAQSM. Primary Care and Sports  Medicine [facility]  Adjunct Professor of [place] of [facility] of Medicine

## 2020-07-20 NOTE — Assessment & Plan Note (Signed)
Annual physical as above. Checking routine labs, we are going to add Cologuard screening, recommendation is 45 and up. He would also like blood typing and Covid antibodies. He does plan to get his Covid vaccine coming up later this week.

## 2020-07-22 LAB — SARS COV-2 SEROLOGY(COVID-19)AB(IGG,IGM),IMMUNOASSAY
SARS CoV-2 AB IgG: NEGATIVE
SARS CoV-2 IgM: NEGATIVE

## 2020-07-22 LAB — COMPLETE METABOLIC PANEL WITH GFR
AG Ratio: 1.7 (calc) (ref 1.0–2.5)
ALT: 18 U/L (ref 9–46)
AST: 18 U/L (ref 10–40)
Albumin: 4.6 g/dL (ref 3.6–5.1)
Alkaline phosphatase (APISO): 77 U/L (ref 36–130)
BUN: 14 mg/dL (ref 7–25)
CO2: 27 mmol/L (ref 20–32)
Calcium: 9.6 mg/dL (ref 8.6–10.3)
Chloride: 102 mmol/L (ref 98–110)
Creat: 0.88 mg/dL (ref 0.60–1.35)
GFR, Est African American: 117 mL/min/{1.73_m2} (ref >=60)
GFR, Est Non African American: 101 mL/min/{1.73_m2} (ref >=60)
Globulin: 2.7 g/dL (calc) (ref 1.9–3.7)
Glucose, Bld: 88 mg/dL (ref 65–99)
Potassium: 4.4 mmol/L (ref 3.5–5.3)
Sodium: 138 mmol/L (ref 135–146)
Total Bilirubin: 0.6 mg/dL (ref 0.2–1.2)
Total Protein: 7.3 g/dL (ref 6.1–8.1)

## 2020-07-22 LAB — CBC
HCT: 44.5 % (ref 38.5–50.0)
Hemoglobin: 15.2 g/dL (ref 13.2–17.1)
MCH: 29.9 pg (ref 27.0–33.0)
MCHC: 34.2 g/dL (ref 32.0–36.0)
MCV: 87.6 fL (ref 80.0–100.0)
MPV: 10.5 fL (ref 7.5–12.5)
Platelets: 203 10*3/uL (ref 140–400)
RBC: 5.08 10*6/uL (ref 4.20–5.80)
RDW: 13.1 % (ref 11.0–15.0)
WBC: 5.1 10*3/uL (ref 3.8–10.8)

## 2020-07-22 LAB — LIPID PANEL W/REFLEX DIRECT LDL
Cholesterol: 157 mg/dL (ref <200)
HDL: 64 mg/dL (ref >=40)
LDL Cholesterol (Calc): 77 mg/dL (calc)
Non-HDL Cholesterol (Calc): 93 mg/dL (calc) (ref <130)
Total CHOL/HDL Ratio: 2.5 (calc) (ref <5.0)
Triglycerides: 82 mg/dL (ref <150)

## 2020-07-22 LAB — ABO AND RH

## 2020-07-22 LAB — TSH: TSH: 2.2 mIU/L (ref 0.40–4.50)

## 2020-08-06 ENCOUNTER — Other Ambulatory Visit: Payer: Self-pay | Admitting: Sports Medicine

## 2020-08-06 DIAGNOSIS — Z1211 Encounter for screening for malignant neoplasm of colon: Secondary | ICD-10-CM | POA: Diagnosis not present

## 2020-08-06 DIAGNOSIS — E782 Mixed hyperlipidemia: Secondary | ICD-10-CM

## 2020-08-06 LAB — COLOGUARD: Cologuard: NEGATIVE

## 2020-08-12 LAB — COLOGUARD: COLOGUARD: NEGATIVE

## 2020-08-12 LAB — EXTERNAL GENERIC LAB PROCEDURE: COLOGUARD: NEGATIVE

## 2020-08-20 DIAGNOSIS — Z03818 Encounter for observation for suspected exposure to other biological agents ruled out: Secondary | ICD-10-CM | POA: Diagnosis not present

## 2020-08-20 DIAGNOSIS — Z20822 Contact with and (suspected) exposure to covid-19: Secondary | ICD-10-CM | POA: Diagnosis not present

## 2020-08-22 ENCOUNTER — Other Ambulatory Visit: Payer: Self-pay

## 2020-08-22 DIAGNOSIS — F909 Attention-deficit hyperactivity disorder, unspecified type: Secondary | ICD-10-CM

## 2020-08-23 MED ORDER — AMPHETAMINE-DEXTROAMPHET ER 30 MG PO CP24: 30.0000 mg | ORAL_CAPSULE | Freq: Every day | ORAL | 0 refills | Status: DC

## 2020-09-24 ENCOUNTER — Other Ambulatory Visit: Payer: Self-pay

## 2020-09-24 DIAGNOSIS — F909 Attention-deficit hyperactivity disorder, unspecified type: Secondary | ICD-10-CM

## 2020-09-28 MED ORDER — AMPHETAMINE-DEXTROAMPHET ER 30 MG PO CP24: 30.0000 mg | ORAL_CAPSULE | Freq: Every day | ORAL | 0 refills | Status: DC

## 2020-10-11 DIAGNOSIS — Z20822 Contact with and (suspected) exposure to covid-19: Secondary | ICD-10-CM | POA: Diagnosis not present

## 2020-11-17 ENCOUNTER — Other Ambulatory Visit: Payer: Self-pay

## 2020-11-17 DIAGNOSIS — F909 Attention-deficit hyperactivity disorder, unspecified type: Secondary | ICD-10-CM

## 2020-11-18 MED ORDER — AMPHETAMINE-DEXTROAMPHET ER 30 MG PO CP24: 30.0000 mg | ORAL_CAPSULE | Freq: Every day | ORAL | 0 refills | Status: DC

## 2020-11-18 NOTE — Telephone Encounter (Signed)
Last written 09/28/2020 #30 no refills Last appt 07/20/2020

## 2020-12-18 ENCOUNTER — Other Ambulatory Visit: Payer: Self-pay

## 2020-12-18 DIAGNOSIS — F909 Attention-deficit hyperactivity disorder, unspecified type: Secondary | ICD-10-CM

## 2020-12-23 ENCOUNTER — Other Ambulatory Visit: Payer: Self-pay | Admitting: Sports Medicine

## 2020-12-23 DIAGNOSIS — F909 Attention-deficit hyperactivity disorder, unspecified type: Secondary | ICD-10-CM

## 2020-12-23 MED ORDER — AMPHETAMINE-DEXTROAMPHET ER 30 MG PO CP24: 30.0000 mg | ORAL_CAPSULE | Freq: Every day | ORAL | 0 refills | Status: DC

## 2020-12-23 NOTE — Telephone Encounter (Signed)
Needs a refill on his  Adderall   Written on 05/18/2020  Last OV 07/20/2020

## 2020-12-31 ENCOUNTER — Ambulatory Visit: Payer: BC Managed Care – PPO | Admitting: Family Medicine

## 2021-01-03 ENCOUNTER — Ambulatory Visit: Payer: BC Managed Care – PPO | Admitting: Sports Medicine

## 2021-01-13 ENCOUNTER — Ambulatory Visit: Payer: BC Managed Care – PPO | Admitting: Sports Medicine

## 2021-01-13 ENCOUNTER — Other Ambulatory Visit: Payer: Self-pay

## 2021-01-13 DIAGNOSIS — R102 Pelvic and perineal pain unspecified side: Secondary | ICD-10-CM | POA: Insufficient documentation

## 2021-01-13 DIAGNOSIS — R1032 Left lower quadrant pain: Secondary | ICD-10-CM | POA: Diagnosis not present

## 2021-01-13 NOTE — Progress Notes (Signed)
    Procedures performed today:    None.  Independent interpretation of notes and tests performed by another provider:   None.  Brief History, Exam, Impression, and Recommendations:    Pelvic pain Jacob Sutton is a very pleasant 50 year old male, he has had several months of left pelvic pain, he tells me his symptoms are very similar to when he had prior inguinal hernias, he did have an open inguinal hernia repair with mesh back in 2010 with Dr. Marlou Starks. On exam today he does have an anterior lower pelvic bulge with Valsalva worse on the right side than the left, hip exam is unremarkable, he does have some discomfort in the back of his leg as well. The differential still is fairly broad, and includes a radicular source of pain. For his previous hernia he did require CT of the abdomen and pelvis to diagnose. Due to the duration of his discomfort and his history of hernia repair we are going to proceed with CT abdomen and pelvis with oral and IV contrast. I would like him to bear down/Valsalva while CT images are being obtained. Treatment and follow-up will depend on CT results.     ___________________________________________ Gwen Her. Dianah Field, M.D., ABFM., CAQSM. Primary Care and [place] Instructor of [place] of [facility] of Medicine

## 2021-01-13 NOTE — Assessment & Plan Note (Signed)
Jacob Sutton is a very pleasant 50 year old male, he has had several months of left pelvic pain, he tells me his symptoms are very similar to when he had prior inguinal hernias, he did have an open inguinal hernia repair with mesh back in 2010 with Dr. Marlou Starks. On exam today he does have an anterior lower pelvic bulge with Valsalva worse on the right side than the left, hip exam is unremarkable, he does have some discomfort in the back of his leg as well. The differential still is fairly broad, and includes a radicular source of pain. For his previous hernia he did require CT of the abdomen and pelvis to diagnose. Due to the duration of his discomfort and his history of hernia repair we are going to proceed with CT abdomen and pelvis with oral and IV contrast. I would like him to bear down/Valsalva while CT images are being obtained. Treatment and follow-up will depend on CT results.

## 2021-01-14 ENCOUNTER — Ambulatory Visit (INDEPENDENT_AMBULATORY_CARE_PROVIDER_SITE_OTHER): Payer: BC Managed Care – PPO

## 2021-01-14 DIAGNOSIS — R1032 Left lower quadrant pain: Secondary | ICD-10-CM

## 2021-01-14 DIAGNOSIS — R102 Pelvic and perineal pain: Secondary | ICD-10-CM

## 2021-01-14 DIAGNOSIS — R109 Unspecified abdominal pain: Secondary | ICD-10-CM | POA: Diagnosis not present

## 2021-01-14 LAB — BASIC METABOLIC PANEL WITH GFR
BUN: 16 mg/dL (ref 7–25)
CO2: 27 mmol/L (ref 20–32)
Calcium: 9.7 mg/dL (ref 8.6–10.3)
Chloride: 103 mmol/L (ref 98–110)
Creat: 0.98 mg/dL (ref 0.60–1.35)
GFR, Est African American: 104 mL/min/{1.73_m2} (ref >=60)
GFR, Est Non African American: 90 mL/min/{1.73_m2} (ref >=60)
Glucose, Bld: 95 mg/dL (ref 65–99)
Potassium: 4.2 mmol/L (ref 3.5–5.3)
Sodium: 138 mmol/L (ref 135–146)

## 2021-01-14 MED ORDER — IOHEXOL 300 MG/ML  SOLN
100.0000 mL | Freq: Once | INTRAMUSCULAR | Status: AC | PRN
  Administered 2021-01-14: 100 mL via INTRAVENOUS

## 2021-01-20 DIAGNOSIS — R102 Pelvic and perineal pain unspecified side: Secondary | ICD-10-CM

## 2021-02-14 ENCOUNTER — Other Ambulatory Visit: Payer: Self-pay | Admitting: Sports Medicine

## 2021-02-14 DIAGNOSIS — F909 Attention-deficit hyperactivity disorder, unspecified type: Secondary | ICD-10-CM

## 2021-03-14 ENCOUNTER — Other Ambulatory Visit: Payer: Self-pay | Admitting: Sports Medicine

## 2021-03-14 DIAGNOSIS — F909 Attention-deficit hyperactivity disorder, unspecified type: Secondary | ICD-10-CM

## 2021-04-18 ENCOUNTER — Other Ambulatory Visit: Payer: Self-pay | Admitting: Sports Medicine

## 2021-04-18 DIAGNOSIS — F909 Attention-deficit hyperactivity disorder, unspecified type: Secondary | ICD-10-CM

## 2021-05-05 ENCOUNTER — Other Ambulatory Visit: Payer: Self-pay | Admitting: Sports Medicine

## 2021-05-05 DIAGNOSIS — E782 Mixed hyperlipidemia: Secondary | ICD-10-CM

## 2021-05-16 ENCOUNTER — Other Ambulatory Visit: Payer: Self-pay | Admitting: Sports Medicine

## 2021-05-16 DIAGNOSIS — F909 Attention-deficit hyperactivity disorder, unspecified type: Secondary | ICD-10-CM

## 2021-07-04 ENCOUNTER — Other Ambulatory Visit: Payer: Self-pay | Admitting: Sports Medicine

## 2021-07-04 DIAGNOSIS — F909 Attention-deficit hyperactivity disorder, unspecified type: Secondary | ICD-10-CM

## 2021-07-20 ENCOUNTER — Ambulatory Visit (INDEPENDENT_AMBULATORY_CARE_PROVIDER_SITE_OTHER): Payer: BC Managed Care – PPO | Admitting: Sports Medicine

## 2021-07-20 ENCOUNTER — Encounter: Payer: Self-pay | Admitting: Sports Medicine

## 2021-07-20 ENCOUNTER — Other Ambulatory Visit: Payer: Self-pay

## 2021-07-20 VITALS — BP 151/103 | HR 87 | Ht 71.5 in | Wt 197.1 lb

## 2021-07-20 DIAGNOSIS — F909 Attention-deficit hyperactivity disorder, unspecified type: Secondary | ICD-10-CM | POA: Diagnosis not present

## 2021-07-20 DIAGNOSIS — N139 Obstructive and reflux uropathy, unspecified: Secondary | ICD-10-CM | POA: Diagnosis not present

## 2021-07-20 DIAGNOSIS — Z0000 Encounter for general adult medical examination without abnormal findings: Secondary | ICD-10-CM | POA: Diagnosis not present

## 2021-07-20 DIAGNOSIS — E782 Mixed hyperlipidemia: Secondary | ICD-10-CM

## 2021-07-20 MED ORDER — AMPHETAMINE-DEXTROAMPHET ER 20 MG PO CP24: 40.0000 mg | ORAL_CAPSULE | Freq: Every day | ORAL | 0 refills | Status: DC

## 2021-07-20 NOTE — Progress Notes (Signed)
Subjective:    CC: Annual Physical Exam  HPI:  This patient is here for their annual physical  I reviewed the past medical history, family history, social history, surgical history, and allergies today and no changes were needed.  Please see the problem list section below in epic for further details.  Past Medical History: No past medical history on file. Past Surgical History: Past Surgical History:  Procedure Laterality Date   APPENDECTOMY     CARDIAC CATHETERIZATION  11/06/12   Clean.   HERNIA REPAIR     VASECTOMY     Social History: Social History   Socioeconomic History   Marital status: Married    Spouse name: Not on file   Number of children: Not on file   Years of education: Not on file   Highest education level: Not on file  Occupational History   Not on file  Tobacco Use   Smoking status: Never   Smokeless tobacco: Never  Substance and Sexual Activity   Alcohol use: Yes   Drug use: No   Sexual activity: Not on file  Other Topics Concern   Not on file  Social History Narrative   Not on file   Social Determinants of Health   Financial Resource Strain: Not on file  Food Insecurity: Not on file  Transportation Needs: Not on file  Physical Activity: Not on file  Stress: Not on file  Social Connections: Not on file   Family History: Family History  Problem Relation Age of Onset   Heart disease Mother    Heart disease Father    Diabetes Father    Allergies: No Known Allergies Medications: See med rec.  Review of Systems: No headache, visual changes, nausea, vomiting, diarrhea, constipation, dizziness, abdominal pain, skin rash, fevers, chills, night sweats, swollen lymph nodes, weight loss, chest pain, body aches, joint swelling, muscle aches, shortness of breath, mood changes, visual or auditory hallucinations.  Objective:    General: Well Developed, well nourished, and in no acute distress.  Neuro: Alert and oriented x3, extra-ocular muscles  intact, sensation grossly intact. Cranial nerves II through XII are intact, motor, sensory, and coordinative functions are all intact. HEENT: Normocephalic, atraumatic, pupils equal round reactive to light, neck supple, no masses, no lymphadenopathy, thyroid nonpalpable. Oropharynx, nasopharynx, external ear canals are unremarkable. Skin: Warm and dry, no rashes noted.  Cardiac: Regular rate and rhythm, no murmurs rubs or gallops.  Respiratory: Clear to auscultation bilaterally. Not using accessory muscles, speaking in full sentences.  Abdominal: Soft, nontender, nondistended, positive bowel sounds, no masses, no organomegaly.  Musculoskeletal: Shoulder, elbow, wrist, hip, knee, ankle stable, and with full range of motion.  Impression and Recommendations:    The patient was counselled, risk factors were discussed, anticipatory guidance given.  Annual physical exam Annual physical as above, checking routine labs.  Adult ADHD We will switch to a different generic Adderall XR which is less effective, we will switch him to Adderall XR 20 mg capsules 2 tabs daily in the hopes of mitigating the decreased efficacy, if insufficient improvement in a virtual visit in a month we will consider switching to Vyvanse or Concerta.  Of note his blood pressure has been elevated, he will check his blood pressures at home after sitting relaxed for 15 minutes and let me know next week, he did have 3 cups of black coffee before coming into the office today. No symptoms of hypertension.   ___________________________________________ Gwen Her. Dianah Field, M.D., ABFM., CAQSM. Primary Care and  Sports Medicine [facility]  Adjunct Professor of [place] of [facility] of Medicine

## 2021-07-20 NOTE — Assessment & Plan Note (Signed)
Annual physical as above, checking routine labs.

## 2021-07-20 NOTE — Assessment & Plan Note (Addendum)
We will switch to a different generic Adderall XR which is less effective, we will switch him to Adderall XR 20 mg capsules 2 tabs daily in the hopes of mitigating the decreased efficacy, if insufficient improvement in a virtual visit in a month we will consider switching to Vyvanse or Concerta.  Of note his blood pressure has been elevated, he will check his blood pressures at home after sitting relaxed for 15 minutes and let me know next week, he did have 3 cups of black coffee before coming into the office today. No symptoms of hypertension.

## 2021-07-21 LAB — LIPID PANEL
Cholesterol: 155 mg/dL (ref <200)
HDL: 62 mg/dL (ref >=40)
LDL Cholesterol (Calc): 80 mg/dL (calc)
Non-HDL Cholesterol (Calc): 93 mg/dL (calc) (ref <130)
Total CHOL/HDL Ratio: 2.5 (calc) (ref <5.0)
Triglycerides: 57 mg/dL (ref <150)

## 2021-07-21 LAB — CBC
HCT: 47.3 % (ref 38.5–50.0)
Hemoglobin: 16.1 g/dL (ref 13.2–17.1)
MCH: 29.8 pg (ref 27.0–33.0)
MCHC: 34 g/dL (ref 32.0–36.0)
MCV: 87.4 fL (ref 80.0–100.0)
MPV: 10 fL (ref 7.5–12.5)
Platelets: 226 10*3/uL (ref 140–400)
RBC: 5.41 10*6/uL (ref 4.20–5.80)
RDW: 12.8 % (ref 11.0–15.0)
WBC: 4.6 10*3/uL (ref 3.8–10.8)

## 2021-07-21 LAB — PSA, TOTAL AND FREE
PSA, % Free: 29 % (calc) (ref >25)
PSA, Free: 0.2 ng/mL
PSA, Total: 0.7 ng/mL (ref <=4.0)

## 2021-07-21 LAB — COMPREHENSIVE METABOLIC PANEL
AG Ratio: 1.7 (calc) (ref 1.0–2.5)
ALT: 20 U/L (ref 9–46)
AST: 18 U/L (ref 10–35)
Albumin: 4.7 g/dL (ref 3.6–5.1)
Alkaline phosphatase (APISO): 79 U/L (ref 35–144)
BUN: 17 mg/dL (ref 7–25)
CO2: 30 mmol/L (ref 20–32)
Calcium: 9.8 mg/dL (ref 8.6–10.3)
Chloride: 102 mmol/L (ref 98–110)
Creat: 0.85 mg/dL (ref 0.70–1.30)
Globulin: 2.8 g/dL (calc) (ref 1.9–3.7)
Glucose, Bld: 106 mg/dL — ABNORMAL HIGH (ref 65–99)
Potassium: 4.8 mmol/L (ref 3.5–5.3)
Sodium: 138 mmol/L (ref 135–146)
Total Bilirubin: 0.6 mg/dL (ref 0.2–1.2)
Total Protein: 7.5 g/dL (ref 6.1–8.1)

## 2021-07-21 LAB — TSH: TSH: 2.57 mIU/L (ref 0.40–4.50)

## 2021-07-25 ENCOUNTER — Telehealth: Payer: Self-pay

## 2021-07-25 NOTE — Telephone Encounter (Signed)
Received fax from [facility] regarding the need for a PA on patient's Adderall. PA completed through Covermymeds and submitted; awaiting response.

## 2021-08-01 ENCOUNTER — Telehealth: Payer: Self-pay

## 2021-08-01 NOTE — Telephone Encounter (Signed)
He will have to do good Rx.  It will be $40-$50 a month.   [email or web address].[email or web address]-[email or web address]_override=amphetamine-salt-combo-[email or web address]

## 2021-08-01 NOTE — Telephone Encounter (Signed)
Patient aware he will need to use Good Rx.

## 2021-08-01 NOTE — Telephone Encounter (Signed)
At the visit you changed his Adderall to twice daily dosing to a total daily dose of 70m. This was denied by his insurance. He only has 5-6 days of the 35mdose left. Please advise.

## 2021-08-17 ENCOUNTER — Telehealth (INDEPENDENT_AMBULATORY_CARE_PROVIDER_SITE_OTHER): Payer: BC Managed Care – PPO | Admitting: Sports Medicine

## 2021-08-17 DIAGNOSIS — F909 Attention-deficit hyperactivity disorder, unspecified type: Secondary | ICD-10-CM | POA: Diagnosis not present

## 2021-08-17 NOTE — Assessment & Plan Note (Signed)
We switched to Adderall XR 20 mg 2 tabs daily, he has responded well, blood pressures are controlled, there is a shortage of Adderall XR so he will probably request a refill approximately 10 days early, I think this is okay. Return as needed

## 2021-08-17 NOTE — Progress Notes (Signed)
   Virtual Visit via WebEx/MyChart   I connected with  Jacob Sutton  on 08/17/21 via WebEx/MyChart/Doximity Video and verified that I am speaking with the correct person using two identifiers.   I discussed the limitations, risks, security and privacy concerns of performing an evaluation and management service by WebEx/MyChart/Doximity Video, including the higher likelihood of inaccurate diagnosis and treatment, and the availability of in person appointments.  We also discussed the likely need of an additional face to face encounter for complete and high quality delivery of care.  I also discussed with the patient that there may be a patient responsible charge related to this service. The patient expressed understanding and wishes to proceed.  Provider location is in medical facility. Patient location is at their home, different from provider location. People involved in care of the patient during this telehealth encounter were myself, my nurse/medical assistant, and my front office/scheduling team member.  Review of Systems: No fevers, chills, night sweats, weight loss, chest pain, or shortness of breath.   Objective Findings:    General: Speaking full sentences, no audible heavy breathing.  Sounds alert and appropriately interactive.  Appears well.  Face symmetric.  Extraocular movements intact.  Pupils equal and round.  No nasal flaring or accessory muscle use visualized.  Independent interpretation of tests performed by another provider:   None.  Brief History, Exam, Impression, and Recommendations:    Adult ADHD We switched to Adderall XR 20 mg 2 tabs daily, he has responded well, blood pressures are controlled, there is a shortage of Adderall XR so he will probably request a refill approximately 10 days early, I think this is okay. Return as needed   I discussed the above assessment and treatment plan with the patient. The patient was provided an opportunity to ask questions and all  were answered. The patient agreed with the plan and demonstrated an understanding of the instructions.   The patient was advised to call back or seek an in-person evaluation if the symptoms worsen or if the condition fails to improve as anticipated.   I provided 30 minutes of face to face and non-face-to-face time during this encounter date, time was needed to gather information, review chart, records, communicate/coordinate with staff remotely, as well as complete documentation.  Specifically we discussed Adderall efficacy, nationwide shortages.   ___________________________________________ Gwen Her. Dianah Field, M.D., ABFM., CAQSM. Primary Care and [place] Instructor of [place] of [facility] of Medicine

## 2021-09-06 ENCOUNTER — Other Ambulatory Visit: Payer: Self-pay | Admitting: Sports Medicine

## 2021-09-06 DIAGNOSIS — F909 Attention-deficit hyperactivity disorder, unspecified type: Secondary | ICD-10-CM

## 2021-09-06 MED ORDER — AMPHETAMINE-DEXTROAMPHET ER 20 MG PO CP24: 40.0000 mg | ORAL_CAPSULE | Freq: Every day | ORAL | 0 refills | Status: DC

## 2021-09-08 ENCOUNTER — Other Ambulatory Visit: Payer: Self-pay

## 2021-09-08 DIAGNOSIS — F909 Attention-deficit hyperactivity disorder, unspecified type: Secondary | ICD-10-CM

## 2021-09-09 MED ORDER — AMPHETAMINE-DEXTROAMPHET ER 20 MG PO CP24: 40.0000 mg | ORAL_CAPSULE | Freq: Every day | ORAL | 0 refills | Status: DC

## 2021-09-09 NOTE — Addendum Note (Signed)
Addended by: Silverio Decamp on: 09/09/2021 09:20 AM   Modules accepted: Orders

## 2021-09-16 ENCOUNTER — Telehealth: Payer: Self-pay

## 2021-09-16 NOTE — Telephone Encounter (Signed)
Medication: amphetamine-dextroamphetamine (ADDERALL XR) 20 MG 24 hr capsule Prior authorization submitted via CoverMyMeds on 09/16/2021 PA submission pending

## 2021-09-19 MED ORDER — AMPHETAMINE-DEXTROAMPHET ER 20 MG PO CP24: 40.0000 mg | ORAL_CAPSULE | Freq: Every day | ORAL | 0 refills | Status: DC

## 2021-09-19 NOTE — Addendum Note (Signed)
Addended by: Silverio Decamp on: 09/19/2021 12:28 PM   Modules accepted: Orders

## 2021-10-19 ENCOUNTER — Other Ambulatory Visit: Payer: Self-pay | Admitting: Sports Medicine

## 2021-10-19 DIAGNOSIS — F909 Attention-deficit hyperactivity disorder, unspecified type: Secondary | ICD-10-CM

## 2021-10-19 MED ORDER — AMPHETAMINE-DEXTROAMPHET ER 20 MG PO CP24: 40.0000 mg | ORAL_CAPSULE | Freq: Every day | ORAL | 0 refills | Status: DC

## 2021-11-20 ENCOUNTER — Other Ambulatory Visit: Payer: Self-pay | Admitting: Sports Medicine

## 2021-11-20 DIAGNOSIS — F909 Attention-deficit hyperactivity disorder, unspecified type: Secondary | ICD-10-CM

## 2021-11-21 MED ORDER — AMPHETAMINE-DEXTROAMPHET ER 20 MG PO CP24: 40.0000 mg | ORAL_CAPSULE | Freq: Every day | ORAL | 0 refills | Status: DC

## 2021-11-22 ENCOUNTER — Encounter: Payer: Self-pay | Admitting: Sports Medicine

## 2021-11-22 DIAGNOSIS — F909 Attention-deficit hyperactivity disorder, unspecified type: Secondary | ICD-10-CM

## 2021-11-22 MED ORDER — LISDEXAMFETAMINE DIMESYLATE 40 MG PO CAPS
40.0000 mg | ORAL_CAPSULE | ORAL | 0 refills | Status: DC
Start: 2021-11-22 — End: 2022-01-09

## 2021-11-22 NOTE — Assessment & Plan Note (Signed)
Adderall XR 20 mg 2 tablets daily insufficient efficacy, switching to Vyvanse. If this gets covered, and insufficient efficacy after a couple of weeks we can go up to 60 mg.

## 2022-01-06 ENCOUNTER — Other Ambulatory Visit: Payer: Self-pay

## 2022-01-06 ENCOUNTER — Ambulatory Visit: Payer: BC Managed Care – PPO | Admitting: Sports Medicine

## 2022-01-06 ENCOUNTER — Ambulatory Visit (INDEPENDENT_AMBULATORY_CARE_PROVIDER_SITE_OTHER): Payer: BC Managed Care – PPO

## 2022-01-06 DIAGNOSIS — M79605 Pain in left leg: Secondary | ICD-10-CM

## 2022-01-06 DIAGNOSIS — M5416 Radiculopathy, lumbar region: Secondary | ICD-10-CM | POA: Diagnosis not present

## 2022-01-06 DIAGNOSIS — M4316 Spondylolisthesis, lumbar region: Secondary | ICD-10-CM | POA: Diagnosis not present

## 2022-01-06 DIAGNOSIS — M545 Low back pain, unspecified: Secondary | ICD-10-CM | POA: Diagnosis not present

## 2022-01-06 MED ORDER — PREDNISONE 50 MG PO TABS: ORAL_TABLET | ORAL | 0 refills | Status: DC

## 2022-01-06 NOTE — Progress Notes (Signed)
? ? ?  Procedures performed today:   ? ?None. ? ?Independent interpretation of notes and tests performed by another provider:  ? ?I did personally review a CT scan of the abdomen pelvis which does show multilevel DDD, with disc protrusions L3-4, L4-5, and L5-S1 with vacuum disc phenomenon, there is also central canal stenosis at L4-L5. ? ?Brief History, Exam, Impression, and Recommendations:   ? ?Left lumbar radiculitis ?This is a pleasant 51 year old male, increasing pain axial low back, radiation down the left lower lateral leg with numbness and tingling, cramping in the legs when walking consistent with neurogenic claudication. ?No red flag symptoms. ?I did personally review a CT scan of the abdomen pelvis which does show multilevel DDD, with disc protrusions L3-4, L4-5, and L5-S1 with vacuum disc phenomenon, there is also central canal stenosis at L4-L5. ?We discussed the anatomy, evolutionary anthropology, and the treatment plan including 5 days of steroids, x-rays and home physical therapy, return to see me in 6 weeks, MRI for interventional planning if no better. ? ? ? ?___________________________________________ ?Gwen Her. Dianah Field, M.D., ABFM., CAQSM. ?Primary Care and Sports Medicine ?[place] ? ?Adjunct Instructor of Family Medicine  ?[employer] ?

## 2022-01-06 NOTE — Assessment & Plan Note (Signed)
This is a pleasant 51 year old male, increasing pain axial low back, radiation down the left lower lateral leg with numbness and tingling, cramping in the legs when walking consistent with neurogenic claudication. ?No red flag symptoms. ?I did personally review a CT scan of the abdomen pelvis which does show multilevel DDD, with disc protrusions L3-4, L4-5, and L5-S1 with vacuum disc phenomenon, there is also central canal stenosis at L4-L5. ?We discussed the anatomy, evolutionary anthropology, and the treatment plan including 5 days of steroids, x-rays and home physical therapy, return to see me in 6 weeks, MRI for interventional planning if no better. ?

## 2022-01-09 ENCOUNTER — Encounter: Payer: Self-pay | Admitting: Sports Medicine

## 2022-01-09 ENCOUNTER — Other Ambulatory Visit: Payer: Self-pay | Admitting: Sports Medicine

## 2022-01-09 DIAGNOSIS — F909 Attention-deficit hyperactivity disorder, unspecified type: Secondary | ICD-10-CM

## 2022-01-09 MED ORDER — LISDEXAMFETAMINE DIMESYLATE 40 MG PO CAPS: 40.0000 mg | ORAL_CAPSULE | ORAL | 0 refills | Status: DC

## 2022-01-11 ENCOUNTER — Telehealth: Payer: Self-pay

## 2022-01-11 NOTE — Telephone Encounter (Signed)
Initiated Prior authorization [id number] 40MG capsules ?Via: Covermymeds ?Case/Key:[id number] ?Status: Pending as of 01/11/22 ?Reason: ?Notified Pt via: Mychart ?

## 2022-02-01 ENCOUNTER — Encounter: Payer: Self-pay | Admitting: Sports Medicine

## 2022-02-01 DIAGNOSIS — F909 Attention-deficit hyperactivity disorder, unspecified type: Secondary | ICD-10-CM

## 2022-02-01 MED ORDER — LISDEXAMFETAMINE DIMESYLATE 50 MG PO CAPS: 50.0000 mg | ORAL_CAPSULE | ORAL | 0 refills | Status: DC

## 2022-02-06 ENCOUNTER — Other Ambulatory Visit: Payer: Self-pay | Admitting: Sports Medicine

## 2022-02-06 DIAGNOSIS — E782 Mixed hyperlipidemia: Secondary | ICD-10-CM

## 2022-02-17 ENCOUNTER — Ambulatory Visit: Payer: BC Managed Care – PPO | Admitting: Sports Medicine

## 2022-02-17 DIAGNOSIS — M5416 Radiculopathy, lumbar region: Secondary | ICD-10-CM

## 2022-02-17 NOTE — Progress Notes (Addendum)
? ? ?  Procedures performed today:   ? ?None. ? ?Independent interpretation of notes and tests performed by another provider:  ? ?None. ? ?Brief History, Exam, Impression, and Recommendations:   ? ?Left lumbar radiculitis ?Jacob Sutton returns, he is a pleasant 51 year old male, we have treated conservatively now for over 6 weeks with physician directed conservative treatment, he had x-rays that were unrevealing, CT showing multilevel DDD with some central canal stenosis at L4-L5. ?Due to failure of conservative treatment, axial pain with radiation down the left leg to the outside of the left foot we will proceed with MRI for epidural planning. ?He does desire that we just order the epidural once I see his MRI. ? ? ? ?___________________________________________ ?Gwen Her. Dianah Field, M.D., ABFM., CAQSM. ?Primary Care and Sports Medicine ?[place] ? ?Adjunct Instructor of Family Medicine  ?[employer] ?

## 2022-02-17 NOTE — Assessment & Plan Note (Addendum)
Jacob Sutton returns, he is a pleasant 51 year old male, we have treated conservatively now for over 6 weeks with physician directed conservative treatment, he had x-rays that were unrevealing, CT showing multilevel DDD with some central canal stenosis at L4-L5. ?Due to failure of conservative treatment, axial pain with radiation down the left leg to the outside of the left foot we will proceed with MRI for epidural planning. ?He does desire that we just order the epidural once I see his MRI. ?

## 2022-02-22 ENCOUNTER — Other Ambulatory Visit: Payer: Self-pay | Admitting: Sports Medicine

## 2022-02-22 DIAGNOSIS — Z0189 Encounter for other specified special examinations: Secondary | ICD-10-CM

## 2022-02-25 ENCOUNTER — Ambulatory Visit (INDEPENDENT_AMBULATORY_CARE_PROVIDER_SITE_OTHER): Payer: BC Managed Care – PPO

## 2022-02-25 DIAGNOSIS — M4316 Spondylolisthesis, lumbar region: Secondary | ICD-10-CM | POA: Diagnosis not present

## 2022-02-25 DIAGNOSIS — Z77018 Contact with and (suspected) exposure to other hazardous metals: Secondary | ICD-10-CM

## 2022-02-25 DIAGNOSIS — M5416 Radiculopathy, lumbar region: Secondary | ICD-10-CM

## 2022-02-25 DIAGNOSIS — M545 Low back pain, unspecified: Secondary | ICD-10-CM | POA: Diagnosis not present

## 2022-02-25 DIAGNOSIS — Z01818 Encounter for other preprocedural examination: Secondary | ICD-10-CM

## 2022-02-25 DIAGNOSIS — Z0189 Encounter for other specified special examinations: Secondary | ICD-10-CM

## 2022-03-01 ENCOUNTER — Encounter: Payer: Self-pay | Admitting: Sports Medicine

## 2022-03-01 DIAGNOSIS — M5416 Radiculopathy, lumbar region: Secondary | ICD-10-CM

## 2022-03-09 ENCOUNTER — Other Ambulatory Visit: Payer: Self-pay | Admitting: Sports Medicine

## 2022-03-09 DIAGNOSIS — F909 Attention-deficit hyperactivity disorder, unspecified type: Secondary | ICD-10-CM

## 2022-03-09 MED ORDER — LISDEXAMFETAMINE DIMESYLATE 50 MG PO CAPS: 50.0000 mg | ORAL_CAPSULE | ORAL | 0 refills | Status: DC

## 2022-03-10 ENCOUNTER — Ambulatory Visit
Admission: RE | Admit: 2022-03-10 | Discharge: 2022-03-10 | Disposition: A | Discharge status: Home / Self Care | Payer: BC Managed Care – PPO | Source: Ambulatory Visit | Attending: Sports Medicine | Admitting: Sports Medicine

## 2022-03-10 DIAGNOSIS — M545 Low back pain, unspecified: Secondary | ICD-10-CM | POA: Diagnosis not present

## 2022-03-10 DIAGNOSIS — M5416 Radiculopathy, lumbar region: Secondary | ICD-10-CM

## 2022-03-10 MED ORDER — IOPAMIDOL (ISOVUE-M 200) INJECTION 41%
1.0000 mL | Freq: Once | INTRAMUSCULAR | Status: AC
  Administered 2022-03-10: 1 mL via EPIDURAL

## 2022-03-10 MED ORDER — METHYLPREDNISOLONE ACETATE 40 MG/ML INJ SUSP (RADIOLOG
80.0000 mg | Freq: Once | INTRAMUSCULAR | Status: AC
  Administered 2022-03-10: 80 mg via EPIDURAL

## 2022-03-10 NOTE — Discharge Instructions (Signed)

## 2022-04-13 ENCOUNTER — Encounter (INDEPENDENT_AMBULATORY_CARE_PROVIDER_SITE_OTHER): Payer: BC Managed Care – PPO | Admitting: Sports Medicine

## 2022-04-13 DIAGNOSIS — M5416 Radiculopathy, lumbar region: Secondary | ICD-10-CM | POA: Diagnosis not present

## 2022-04-14 NOTE — Telephone Encounter (Signed)
I spent 5 total minutes of online digital evaluation and management services in this patient-initiated request for online care.

## 2022-04-25 ENCOUNTER — Ambulatory Visit
Admission: RE | Admit: 2022-04-25 | Discharge: 2022-04-25 | Disposition: A | Discharge status: Home / Self Care | Payer: BC Managed Care – PPO | Source: Ambulatory Visit | Attending: Sports Medicine | Admitting: Sports Medicine

## 2022-04-25 DIAGNOSIS — M5416 Radiculopathy, lumbar region: Secondary | ICD-10-CM

## 2022-04-25 DIAGNOSIS — M47817 Spondylosis without myelopathy or radiculopathy, lumbosacral region: Secondary | ICD-10-CM | POA: Diagnosis not present

## 2022-04-25 MED ORDER — METHYLPREDNISOLONE ACETATE 40 MG/ML INJ SUSP (RADIOLOG
80.0000 mg | Freq: Once | INTRAMUSCULAR | Status: AC
  Administered 2022-04-25: 80 mg via EPIDURAL

## 2022-04-25 MED ORDER — IOPAMIDOL (ISOVUE-M 200) INJECTION 41%
1.0000 mL | Freq: Once | INTRAMUSCULAR | Status: AC
  Administered 2022-04-25: 1 mL via EPIDURAL

## 2022-04-25 NOTE — Discharge Instructions (Signed)

## 2022-07-02 DIAGNOSIS — B9689 Other specified bacterial agents as the cause of diseases classified elsewhere: Secondary | ICD-10-CM | POA: Diagnosis not present

## 2022-07-02 DIAGNOSIS — J208 Acute bronchitis due to other specified organisms: Secondary | ICD-10-CM | POA: Diagnosis not present

## 2022-07-02 DIAGNOSIS — R059 Cough, unspecified: Secondary | ICD-10-CM | POA: Diagnosis not present

## 2022-07-02 DIAGNOSIS — R0981 Nasal congestion: Secondary | ICD-10-CM | POA: Diagnosis not present

## 2022-08-02 ENCOUNTER — Ambulatory Visit (INDEPENDENT_AMBULATORY_CARE_PROVIDER_SITE_OTHER): Payer: BC Managed Care – PPO | Admitting: Sports Medicine

## 2022-08-02 ENCOUNTER — Encounter: Payer: Self-pay | Admitting: Sports Medicine

## 2022-08-02 VITALS — BP 117/78 | HR 76 | Ht 71.5 in | Wt 218.0 lb

## 2022-08-02 DIAGNOSIS — N139 Obstructive and reflux uropathy, unspecified: Secondary | ICD-10-CM | POA: Diagnosis not present

## 2022-08-02 DIAGNOSIS — Z0000 Encounter for general adult medical examination without abnormal findings: Secondary | ICD-10-CM | POA: Diagnosis not present

## 2022-08-02 DIAGNOSIS — E782 Mixed hyperlipidemia: Secondary | ICD-10-CM | POA: Diagnosis not present

## 2022-08-02 NOTE — Progress Notes (Signed)
  Subjective:    CC: Annual Physical Exam  HPI:  This patient is here for their annual physical  I reviewed the past medical history, family history, social history, surgical history, and allergies today and no changes were needed.  Please see the problem list section below in epic for further details.  Past Medical History: No past medical history on file. Past Surgical History: Past Surgical History:  Procedure Laterality Date   APPENDECTOMY     CARDIAC CATHETERIZATION  11/06/12   Clean.   HERNIA REPAIR     VASECTOMY     Social History: Social History   Socioeconomic History   Marital status: Married    Spouse name: Not on file   Number of children: Not on file   Years of education: Not on file   Highest education level: Not on file  Occupational History   Not on file  Tobacco Use   Smoking status: Never   Smokeless tobacco: Never  Substance and Sexual Activity   Alcohol use: Yes   Drug use: No   Sexual activity: Not on file  Other Topics Concern   Not on file  Social History Narrative   Not on file   Social Determinants of Health   Financial Resource Strain: Not on file  Food Insecurity: Not on file  Transportation Needs: Not on file  Physical Activity: Not on file  Stress: Not on file  Social Connections: Not on file   Family History: Family History  Problem Relation Age of Onset   Heart disease Mother    Heart disease Father    Diabetes Father    Allergies: No Known Allergies Medications: See med rec.  Review of Systems: No headache, visual changes, nausea, vomiting, diarrhea, constipation, dizziness, abdominal pain, skin rash, fevers, chills, night sweats, swollen lymph nodes, weight loss, chest pain, body aches, joint swelling, muscle aches, shortness of breath, mood changes, visual or auditory hallucinations.  Objective:    General: Well Developed, well nourished, and in no acute distress.  Neuro: Alert and oriented x3, extra-ocular muscles  intact, sensation grossly intact. Cranial nerves II through XII are intact, motor, sensory, and coordinative functions are all intact. HEENT: Normocephalic, atraumatic, pupils equal round reactive to light, neck supple, no masses, no lymphadenopathy, thyroid nonpalpable. Oropharynx, nasopharynx, external ear canals are unremarkable. Skin: Warm and dry, no rashes noted.  Cardiac: Regular rate and rhythm, no murmurs rubs or gallops.  Respiratory: Clear to auscultation bilaterally. Not using accessory muscles, speaking in full sentences.  Abdominal: Soft, nontender, nondistended, positive bowel sounds, no masses, no organomegaly.  Musculoskeletal: Shoulder, elbow, wrist, hip, knee, ankle stable, and with full range of motion.  Impression and Recommendations:    The patient was counselled, risk factors were discussed, anticipatory guidance given.  Annual physical exam Healthy male, annual physical as above, declines flu and Shingrix, return to see me in a year for fasting physical.   ____________________________________________ Gwen Her. Dianah Field, M.D., ABFM., CAQSM., AME. Primary Care and Sports Medicine [facility]  Adjunct Professor of [place] [employer] [facility] [employer]  Risk manager

## 2022-08-02 NOTE — Assessment & Plan Note (Signed)
Healthy male, annual physical as above, declines flu and Shingrix, return to see me in a year for fasting physical.

## 2022-08-04 LAB — PSA, TOTAL AND FREE
PSA, % Free: 29 % (calc) (ref >25)
PSA, Free: 0.2 ng/mL
PSA, Total: 0.7 ng/mL (ref <=4.0)

## 2022-08-04 LAB — COMPLETE METABOLIC PANEL WITH GFR
AG Ratio: 1.6 (calc) (ref 1.0–2.5)
ALT: 19 U/L (ref 9–46)
AST: 17 U/L (ref 10–35)
Albumin: 4.6 g/dL (ref 3.6–5.1)
Alkaline phosphatase (APISO): 80 U/L (ref 35–144)
BUN: 19 mg/dL (ref 7–25)
CO2: 26 mmol/L (ref 20–32)
Calcium: 9.8 mg/dL (ref 8.6–10.3)
Chloride: 103 mmol/L (ref 98–110)
Creat: 0.9 mg/dL (ref 0.70–1.30)
Globulin: 2.9 g/dL (calc) (ref 1.9–3.7)
Glucose, Bld: 93 mg/dL (ref 65–99)
Potassium: 4.5 mmol/L (ref 3.5–5.3)
Sodium: 138 mmol/L (ref 135–146)
Total Bilirubin: 0.7 mg/dL (ref 0.2–1.2)
Total Protein: 7.5 g/dL (ref 6.1–8.1)
eGFR: 103 mL/min/{1.73_m2} (ref >=60)

## 2022-08-04 LAB — CBC
HCT: 42.6 % (ref 38.5–50.0)
Hemoglobin: 14.7 g/dL (ref 13.2–17.1)
MCH: 30.1 pg (ref 27.0–33.0)
MCHC: 34.5 g/dL (ref 32.0–36.0)
MCV: 87.3 fL (ref 80.0–100.0)
MPV: 9.8 fL (ref 7.5–12.5)
Platelets: 227 10*3/uL (ref 140–400)
RBC: 4.88 10*6/uL (ref 4.20–5.80)
RDW: 12.9 % (ref 11.0–15.0)
WBC: 4.9 10*3/uL (ref 3.8–10.8)

## 2022-08-04 LAB — LIPID PANEL
Cholesterol: 142 mg/dL (ref <200)
HDL: 52 mg/dL (ref >=40)
LDL Cholesterol (Calc): 76 mg/dL (calc)
Non-HDL Cholesterol (Calc): 90 mg/dL (calc) (ref <130)
Total CHOL/HDL Ratio: 2.7 (calc) (ref <5.0)
Triglycerides: 63 mg/dL (ref <150)

## 2022-08-04 LAB — HEMOGLOBIN A1C
Hgb A1c MFr Bld: 5.4 % of total Hgb (ref <5.7)
Mean Plasma Glucose: 108 mg/dL
eAG (mmol/L): 6 mmol/L

## 2022-08-04 LAB — TSH: TSH: 1.9 mIU/L (ref 0.40–4.50)

## 2022-08-12 ENCOUNTER — Other Ambulatory Visit: Payer: Self-pay | Admitting: Sports Medicine

## 2022-08-12 DIAGNOSIS — F909 Attention-deficit hyperactivity disorder, unspecified type: Secondary | ICD-10-CM

## 2022-08-14 MED ORDER — LISDEXAMFETAMINE DIMESYLATE 50 MG PO CAPS: 50.0000 mg | ORAL_CAPSULE | ORAL | 0 refills | Status: DC

## 2022-08-17 ENCOUNTER — Encounter (INDEPENDENT_AMBULATORY_CARE_PROVIDER_SITE_OTHER): Payer: BC Managed Care – PPO | Admitting: Sports Medicine

## 2022-08-17 DIAGNOSIS — M5416 Radiculopathy, lumbar region: Secondary | ICD-10-CM | POA: Diagnosis not present

## 2022-08-18 NOTE — Telephone Encounter (Signed)
I spent 5 total minutes of online digital evaluation and management services in this patient-initiated request for online care. 

## 2022-10-18 ENCOUNTER — Encounter: Payer: Self-pay | Admitting: Sports Medicine

## 2022-10-18 DIAGNOSIS — M5416 Radiculopathy, lumbar region: Secondary | ICD-10-CM

## 2022-11-07 ENCOUNTER — Encounter (INDEPENDENT_AMBULATORY_CARE_PROVIDER_SITE_OTHER): Payer: BC Managed Care – PPO | Admitting: Sports Medicine

## 2022-11-07 DIAGNOSIS — F909 Attention-deficit hyperactivity disorder, unspecified type: Secondary | ICD-10-CM | POA: Diagnosis not present

## 2022-11-07 MED ORDER — AMPHETAMINE-DEXTROAMPHET ER 30 MG PO CP24: 30.0000 mg | ORAL_CAPSULE | ORAL | 0 refills | Status: DC

## 2022-11-07 NOTE — Telephone Encounter (Signed)
I spent 5 total minutes of online digital evaluation and management services in this patient-initiated request for online care. 

## 2022-11-18 ENCOUNTER — Other Ambulatory Visit: Payer: Self-pay | Admitting: Sports Medicine

## 2022-11-18 DIAGNOSIS — E782 Mixed hyperlipidemia: Secondary | ICD-10-CM

## 2022-11-21 ENCOUNTER — Other Ambulatory Visit: Payer: Self-pay

## 2022-11-21 ENCOUNTER — Ambulatory Visit: Payer: BC Managed Care – PPO | Attending: Neurological Surgery | Admitting: Physical Therapy

## 2022-11-21 DIAGNOSIS — R2689 Other abnormalities of gait and mobility: Secondary | ICD-10-CM | POA: Diagnosis present

## 2022-11-21 DIAGNOSIS — M5459 Other low back pain: Secondary | ICD-10-CM | POA: Diagnosis present

## 2022-11-21 DIAGNOSIS — M5416 Radiculopathy, lumbar region: Secondary | ICD-10-CM | POA: Diagnosis present

## 2022-11-21 DIAGNOSIS — M6281 Muscle weakness (generalized): Secondary | ICD-10-CM | POA: Insufficient documentation

## 2022-11-21 NOTE — Therapy (Addendum)
OUTPATIENT PHYSICAL THERAPY THORACOLUMBAR EVALUATION AND DISCHARGE   Patient Name: Jacob Sutton MRN: [id number] DOB:1971/01/16, 52 y.o., male Today's Date: 11/21/2022  END OF SESSION:  PT End of Session - 11/21/22 0801     Visit Number 1    Number of Visits 16    Date for PT Re-Evaluation 01/16/23    PT Start Time 0802    PT Stop Time 0845    PT Time Calculation (min) 43 min    Activity Tolerance Patient tolerated treatment well             No past medical history on file. Past Surgical History:  Procedure Laterality Date   APPENDECTOMY     CARDIAC CATHETERIZATION  11/06/12   Clean.   HERNIA REPAIR     VASECTOMY     Patient Active Problem List   Diagnosis Date Noted   Left lumbar radiculitis 01/06/2022   Pelvic pain 01/13/2021   Adult ADHD 08/14/2019   Hyperlipidemia, mixed 07/02/2018   Hearing loss 05/18/2016   Lateral epicondylitis of right elbow 05/18/2016   Annual physical exam 04/04/2013    PCP: Rodney Langton  REFERRING PROVIDER: Tia Alert, MD  REFERRING DIAG: M54.16 (ICD-10-CM) - Radiculopathy, lumbar regio  Rationale for Evaluation and Treatment: Rehabilitation  THERAPY DIAG:  Radiculopathy, lumbar region  Other low back pain  Muscle weakness (generalized)  Other abnormalities of gait and mobility  ONSET DATE: ~ a year ago  SUBJECTIVE:                                                                                                                                                                                           SUBJECTIVE STATEMENT: States for about a year, at the end of the day pt would start to get pain the back of his L leg (not always all the time). Nothing too excruciating. This time last year it got to every day. Pt was given a list of exercises to do but they didn't seem to help (he currently can't remember what they were). MRI was ordered and he did injections but they didn't seem to do anything. Right before Christmas  of this year his right leg started up. Currently R LE is worse than L. Was recommended to see neurosurgeon -- will be getting new MRI today. Has finished taking 6 days of steroids but hasn't felt significant relief.   PERTINENT HISTORY:  N/a  PAIN:  Are you having pain? Yes: NPRS scale: Currently 7 or 8, at worst 10/10 Pain location: back posterior thigh radiating to anterior knee and down to front of foot Pain description: Burning pain  Aggravating factors: Doesn't seem to matter what position Relieving factors: Nothing alleviates  PRECAUTIONS: None  WEIGHT BEARING RESTRICTIONS: No  FALLS:  Has patient fallen in last 6 months? No  LIVING ENVIRONMENT: Lives with: lives with their spouse Lives in: House/apartment  OCCUPATION: Owns Administrator, [employer]  PLOF: Independent  PATIENT GOALS: Improve pain and return to normal activities  NEXT MD VISIT: Will see neurosurgeon today  OBJECTIVE:   DIAGNOSTIC FINDINGS:  MRI on 02/25/22 IMPRESSION: 1. At L5-S1 there is a broad-based disc bulge with a left foraminal/lateral broad disc protrusion. Severe left foraminal stenosis. 2. At L4-5 there is a broad-based disc bulge with a central disc protrusion with cephalad subligamentous migration of disc material. Mild bilateral facet arthropathy. Bilateral subarticular recess stenosis. Mild right foraminal stenosis. No left foraminal stenosis. Mild spinal stenosis. 3. At L3-4 there is a broad-based disc bulge eccentric towards the right flattening the ventral thecal sac. Right subarticular recess stenosis. 4. At L2-3 there is a mild broad-based disc bulge with a broad shallow left foraminal/lateral disc protrusion. Mild bilateral facet arthropathy. Mild left foraminal stenosis. 5. At L1-2 there is a small right paracentral disc protrusion. 6.  No acute osseous injury of the lumbar spine.    PATIENT SURVEYS:  FOTO 33; predicted 1053  SCREENING FOR RED FLAGS: Bowel or bladder incontinence:  No Spinal tumors: No Cauda equina syndrome: No Compression fracture: No Abdominal aneurysm: No  COGNITION: Overall cognitive status: Within functional limits for tasks assessed     SENSATION: Burning  MUSCLE LENGTH: Hamstrings: Right 60 deg; Left 60 deg Thomas test: Right -50 deg; Left -20 deg  POSTURE: No Significant postural limitations  PALPATION: TTP R>L TFL and psoas  LUMBAR ROM:   AROM eval  Flexion 100%  Extension 100%  Right lateral flexion 100% increase in pain  Left lateral flexion 100%  Right rotation 80%  Left rotation 80%    (Blank rows = not tested)  LOWER EXTREMITY ROM:   Limited bilat hip ext due to very tight hip flexors (see thomas test)  LOWER EXTREMITY MMT:    MMT Right eval Left eval  Hip flexion 3+ 4  Hip extension 3- 3-  Hip abduction 3+ 3+  Hip adduction    Hip internal rotation    Hip external rotation    Knee flexion 3+ 4+  Knee extension 3+ 5  Ankle dorsiflexion    Ankle plantarflexion    Ankle inversion    Ankle eversion     (Blank rows = not tested)  LUMBAR SPECIAL TESTS:  Straight leg raise test: (+) bilat pain in front of thigh, Slump test: Positive, FABER test: TBA, and Thomas test: Positive, Craig's test (+) on R but able to flex knee past 90 deg  FUNCTIONAL TESTS:  5 times sit to stand: TBA  GAIT: Distance walked: 100' Assistive device utilized: None Level of assistance: Complete Independence Comments: Antalgic, decreased R weight shift, maintains knees mostly extended  [facility]:                                                DATE: 11/21/22 Therapeutic Exercise: Maisie Fushomas stretch 2x30 sec Prone press ups on forearms x2 min Prone hip ext x10 S/L hip abd x10 Manual Therapy: Skilled assessment and palpation for TPDN Trigger Point Dry-Needling  Treatment instructions: Expect mild to moderate muscle  soreness. S/S of pneumothorax if dry needled over a lung field, and to seek immediate medical attention  should they occur. Patient verbalized understanding of these instructions and education.  Patient Consent Given: Yes Education handout provided: No Muscles treated: R iliopsoas, TFL Electrical stimulation performed: No Parameters: N/A Treatment response/outcome: Twitch response ilicited, palpable increase in muscle extensibility Self Care: Self trigger point release of psoas      PATIENT EDUCATION:  Education details: Exam findings, POC, initial HEP Person educated: Patient Education method: Explanation, Demonstration, and Handouts Education comprehension: verbalized understanding, returned demonstration, and needs further education  HOME EXERCISE PROGRAM: Access Code: [email or web address] URL: [email or web address] Date: 11/21/2022 Prepared by: Vernon Prey April Kirstie Peri  Exercises - Psoas Mobilization with Small Ball  - 1 x daily - 7 x weekly - 2 sets - 30 sec hold - Hip Flexor Stretch at Edge of Bed  - 1 x daily - 7 x weekly - 2 sets - 30 sec hold - Prone Press Up On Elbows  - 1 x daily - 7 x weekly - 2 sets - 10 reps - Prone Hip Extension  - 1 x daily - 7 x weekly - 2 sets - 10 reps - Sidelying Hip Abduction  - 1 x daily - 7 x weekly - 2 sets - 10 reps  ASSESSMENT:  CLINICAL IMPRESSION: Patient is a 52 y.o. M who was seen today for physical therapy evaluation and treatment for bilat lumbar radiculopathy (R worse than L). PMH significant for multiple disc bulges and stenosis (see MRI above). Assessment significant for abnormal sensation and weakness primarily in L4-L5 distribution with significantly tight hip flexors/psoas likely also contributing to nerve impingement. Pt demos very weak hips (R worse than L). Performed trial of TPDN this session and provided HEP to address tightness and weakness. Pt will be following up with neurosurgeon today to get further imaging and recommendations.   OBJECTIVE IMPAIRMENTS: Abnormal gait, decreased mobility, difficulty walking,  decreased ROM, decreased strength, increased fascial restrictions, increased muscle spasms, improper body mechanics, postural dysfunction, and pain.   ACTIVITY LIMITATIONS: bending, sitting, standing, squatting, bed mobility, and locomotion level  PARTICIPATION LIMITATIONS: cleaning, community activity, occupation, and yard work  PERSONAL FACTORS: Fitness, Past/current experiences, Profession, and Time since onset of injury/illness/exacerbation are also affecting patient's functional outcome.   REHAB POTENTIAL: Good  CLINICAL DECISION MAKING: Evolving/moderate complexity  EVALUATION COMPLEXITY: Moderate   GOALS: Goals reviewed with patient? Yes  SHORT TERM GOALS: Target date: 12/19/2022   Pt will be ind with initial HEP Baseline: Goal status: INITIAL  2.  Pt will be able to demo at least -10 deg during thomas test to demo improving hip flexor extensibility Baseline:  Goal status: INITIAL   LONG TERM GOALS: Target date: 01/16/2023   Pt will be ind with advancement and progression of HEP Baseline:  Goal status: INITIAL  2.  Pt will be able to demo at least 4+/5 bilat hip strength for improved transfers and mobility Baseline:  Goal status: INITIAL  3.  Pt will be able to get thighs flat on table during thomas test to demo improved hip flexor extensibility Baseline:  Goal status: INITIAL  4.  Pt will report improvement of symptoms by >/=50% Baseline:  Goal status: INITIAL  5.  Pt will have increased FOTO to >/= 53 Baseline:  Goal status: INITIAL   PLAN:  PT FREQUENCY: 2x/week  PT DURATION: 8 weeks  PLANNED INTERVENTIONS: Therapeutic exercises, Therapeutic activity, Neuromuscular re-education, Balance training, Gait training, Patient/Family education,  Self Care, Joint mobilization, Stair training, Aquatic Therapy, Dry Needling, Electrical stimulation, Spinal mobilization, Cryotherapy, Moist heat, Taping, Traction, Ionotophoresis 4mg /ml Dexamethasone, Manual  therapy, and Re-evaluation.  PLAN FOR NEXT SESSION: Assess response to HEP. Manual work/TPDN as indicated for iliopsoas/hip flexors. Initiate core strengthening. Continue to work on hip strengthening and hip flexor stretching. Consider addition of nerve glides.   PHYSICAL THERAPY DISCHARGE SUMMARY  Visits from Start of Care: 1  Current functional level related to goals / functional outcomes: Eval only   Remaining deficits: See above   Education / Equipment: HEP   Patient agrees to discharge. Patient goals were not met. Patient is being discharged due to not returning since the last visit. Reggy EyeKaren Donawerth, PT,DPT04/09/249:51 AM  Vernon PreyGellen April Ma L ColumbiaNonato, PT 11/21/2022, 9:21 AM

## 2022-11-24 ENCOUNTER — Ambulatory Visit: Payer: BC Managed Care – PPO

## 2022-11-28 ENCOUNTER — Encounter: Payer: Self-pay | Admitting: Physical Therapy

## 2022-12-07 ENCOUNTER — Other Ambulatory Visit: Payer: Self-pay | Admitting: Sports Medicine

## 2022-12-07 DIAGNOSIS — F909 Attention-deficit hyperactivity disorder, unspecified type: Secondary | ICD-10-CM

## 2022-12-07 MED ORDER — AMPHETAMINE-DEXTROAMPHET ER 30 MG PO CP24: 30.0000 mg | ORAL_CAPSULE | ORAL | 0 refills | Status: DC

## 2023-01-10 ENCOUNTER — Other Ambulatory Visit: Payer: Self-pay | Admitting: Sports Medicine

## 2023-01-10 DIAGNOSIS — F909 Attention-deficit hyperactivity disorder, unspecified type: Secondary | ICD-10-CM

## 2023-01-10 MED ORDER — AMPHETAMINE-DEXTROAMPHET ER 30 MG PO CP24: 30.0000 mg | ORAL_CAPSULE | ORAL | 0 refills | Status: DC

## 2023-06-14 ENCOUNTER — Encounter: Payer: Self-pay | Admitting: Sports Medicine

## 2023-06-15 ENCOUNTER — Ambulatory Visit (INDEPENDENT_AMBULATORY_CARE_PROVIDER_SITE_OTHER): Payer: BC Managed Care – PPO | Admitting: Sports Medicine

## 2023-06-15 VITALS — BP 131/93 | HR 80 | Ht 71.5 in | Wt 205.0 lb

## 2023-06-15 DIAGNOSIS — I10 Essential (primary) hypertension: Secondary | ICD-10-CM

## 2023-06-15 HISTORY — DX: Essential (primary) hypertension: I10

## 2023-06-15 NOTE — Progress Notes (Signed)
Pt presents to clinic today for BP check. Denies any cp/sob/palpitaions/headaches/dizziness or swelling.   He reports that he has been checking his BP at home in the morning and in the evening. He usually checks is BP in his R arm. He is not currently taking any BP medications. He did check his BP this morning prior to coming in and it was 162/109.   I had pt to sit for 10 mins and rechecked   Spoke w/pcp he would like for pt to do DASH diet for 4 weeks and RTC  for recheck of bp

## 2023-06-15 NOTE — Patient Instructions (Addendum)

## 2023-06-15 NOTE — Assessment & Plan Note (Signed)
Blood pressure at home, elevated on 2 occasions here in a nurse visit, patient does not want to stop his attention deficit medication, we will have him work aggressively on the DASH diet for 4 weeks and then recheck, if blood pressure still elevated will consider the addition of amlodipine.

## 2023-07-13 ENCOUNTER — Ambulatory Visit (INDEPENDENT_AMBULATORY_CARE_PROVIDER_SITE_OTHER): Payer: BC Managed Care – PPO | Admitting: Sports Medicine

## 2023-07-13 VITALS — BP 134/85 | HR 82 | Ht 71.5 in

## 2023-07-13 DIAGNOSIS — I10 Essential (primary) hypertension: Secondary | ICD-10-CM | POA: Diagnosis not present

## 2023-07-13 NOTE — Patient Instructions (Signed)
Per Dr. Benjamin Stain patient should continue with [name] diet, check his BP ( after sitting and resting for at least 5 minutes before reading is obtained) and keep log for the next two weeks and send through Mychart to provider.

## 2023-07-13 NOTE — Progress Notes (Signed)
   Established Patient Office Visit  Subjective   Patient ID: Jacob Sutton, male    DOB: 20-May-1971  Age: 52 y.o. MRN: [id number]  Chief Complaint  Patient presents with   Hypertension    BP check nurse visit.     HPI  Hypertension- BP check. Patient denies chest pain, shortness of breath, dizziness, palpitations or medication problems.   ROS    Objective:     BP (!) 142/76   Pulse 82   Ht 5' 11.5" (1.816 m)   SpO2 100%   BMI 28.19 kg/m    Physical Exam   No results found for any visits on 07/13/23.    The 10-year ASCVD risk score (Arnett DK, et al., 2019) is: 3.1%    Assessment & Plan:  BP check nurse visit. Initial reading = 142/76. Second reading = 134/85. Per Dr. Benjamin Stain patient should continue with [name] diet, check his BP ( after sitting and resting for at least 5 minutes before reading is obtained) and keep log for the next two weeks and send through Mychart to provider.  Problem List Items Addressed This Visit   None   No follow-ups on file.    Elizabeth Palau, LPN

## 2023-08-07 ENCOUNTER — Encounter: Payer: Self-pay | Admitting: Sports Medicine

## 2023-08-07 ENCOUNTER — Ambulatory Visit (INDEPENDENT_AMBULATORY_CARE_PROVIDER_SITE_OTHER): Payer: BC Managed Care – PPO | Admitting: Sports Medicine

## 2023-08-07 VITALS — BP 122/82 | HR 82 | Ht 71.5 in | Wt 206.0 lb

## 2023-08-07 DIAGNOSIS — N139 Obstructive and reflux uropathy, unspecified: Secondary | ICD-10-CM

## 2023-08-07 DIAGNOSIS — Z0000 Encounter for general adult medical examination without abnormal findings: Secondary | ICD-10-CM

## 2023-08-07 DIAGNOSIS — Z1211 Encounter for screening for malignant neoplasm of colon: Secondary | ICD-10-CM | POA: Diagnosis not present

## 2023-08-07 DIAGNOSIS — E782 Mixed hyperlipidemia: Secondary | ICD-10-CM

## 2023-08-07 DIAGNOSIS — I10 Essential (primary) hypertension: Secondary | ICD-10-CM

## 2023-08-07 MED ORDER — AMLODIPINE BESYLATE 5 MG PO TABS
5.0000 mg | ORAL_TABLET | Freq: Every day | ORAL | 3 refills | Status: DC
Start: 2023-08-07 — End: 2023-10-23

## 2023-08-07 MED ORDER — ATORVASTATIN CALCIUM 10 MG PO TABS: 10.0000 mg | ORAL_TABLET | Freq: Every day | ORAL | 2 refills | Status: DC

## 2023-08-07 NOTE — Assessment & Plan Note (Signed)
Minimally elevated, he is working on the [employer], not exercising much as he is status post back surgery. Still elevated on recheck. Adding amlodipine 5, patient will send me a log in 2 weeks.

## 2023-08-07 NOTE — Assessment & Plan Note (Signed)
Fasting annual physical as above. Declines Shingrix for now. Routine labs. Cologuard.

## 2023-08-07 NOTE — Progress Notes (Signed)
  Subjective:    CC: Annual Physical Exam  HPI:  This patient is here for their annual physical  I reviewed the past medical history, family history, social history, surgical history, and allergies today and no changes were needed.  Please see the problem list section below in epic for further details.  Past Medical History: Past Medical History:  Diagnosis Date   Benign essential hypertension 06/15/2023   Past Surgical History: Past Surgical History:  Procedure Laterality Date   APPENDECTOMY     CARDIAC CATHETERIZATION  11/06/12   Clean.   HERNIA REPAIR     VASECTOMY     Social History: Social History   Socioeconomic History   Marital status: Married    Spouse name: Not on file   Number of children: Not on file   Years of education: Not on file   Highest education level: Not on file  Occupational History   Not on file  Tobacco Use   Smoking status: Never   Smokeless tobacco: Never  Substance and Sexual Activity   Alcohol use: Yes   Drug use: No   Sexual activity: Not on file  Other Topics Concern   Not on file  Social History Narrative   Not on file   Social Determinants of Health   Financial Resource Strain: Not on file  Food Insecurity: Not on file  Transportation Needs: Not on file  Physical Activity: Not on file  Stress: Not on file  Social Connections: Unknown (03/18/2022)   Received from [facility]   Social Network    Social Network: Not on file   Family History: Family History  Problem Relation Age of Onset   Heart disease Mother    Heart disease Father    Diabetes Father    Allergies: No Known Allergies Medications: See med rec.  Review of Systems: No headache, visual changes, nausea, vomiting, diarrhea, constipation, dizziness, abdominal pain, skin rash, fevers, chills, night sweats, swollen lymph nodes, weight loss, chest pain, body aches, joint swelling, muscle aches, shortness of breath, mood changes, visual or auditory  hallucinations.  Objective:    General: Well Developed, well nourished, and in no acute distress.  Neuro: Alert and oriented x3, extra-ocular muscles intact, sensation grossly intact. Cranial nerves II through XII are intact, motor, sensory, and coordinative functions are all intact. HEENT: Normocephalic, atraumatic, pupils equal round reactive to light, neck supple, no masses, no lymphadenopathy, thyroid nonpalpable. Oropharynx, nasopharynx, external ear canals are unremarkable. Skin: Warm and dry, no rashes noted.  Cardiac: Regular rate and rhythm, no murmurs rubs or gallops.  Respiratory: Clear to auscultation bilaterally. Not using accessory muscles, speaking in full sentences.  Abdominal: Soft, nontender, nondistended, positive bowel sounds, no masses, no organomegaly.  Musculoskeletal: Shoulder, elbow, wrist, hip, knee, ankle stable, and with full range of motion.  Impression and Recommendations:    The patient was counselled, risk factors were discussed, anticipatory guidance given.  No problem-specific Assessment & Plan notes found for this encounter.   ____________________________________________ Ihor Austin. Benjamin Stain, M.D., ABFM., CAQSM., AME. Primary Care and Sports Medicine [facility]  Adjunct Professor of Family Medicine  [place] [employer] [facility] [employer]  Restaurant manager, fast food

## 2023-08-07 NOTE — Assessment & Plan Note (Signed)
Historically atorvastatin 10 has been effective, rechecking lipids.

## 2023-08-08 LAB — PSA, TOTAL AND FREE
PSA, Free Pct: 30.8 %
PSA, Free: 0.4 ng/mL
Prostate Specific Ag, Serum: 1.3 ng/mL (ref 0.0–4.0)

## 2023-08-08 LAB — CBC
Hematocrit: 50.2 % (ref 37.5–51.0)
Hemoglobin: 16.8 g/dL (ref 13.0–17.7)
MCH: 30.2 pg (ref 26.6–33.0)
MCHC: 33.5 g/dL (ref 31.5–35.7)
MCV: 90 fL (ref 79–97)
Platelets: 248 10*3/uL (ref 150–450)
RBC: 5.56 x10E6/uL (ref 4.14–5.80)
RDW: 12.5 % (ref 11.6–15.4)
WBC: 7.3 10*3/uL (ref 3.4–10.8)

## 2023-08-08 LAB — COMPREHENSIVE METABOLIC PANEL
ALT: 19 [IU]/L (ref 0–44)
AST: 18 [IU]/L (ref 0–40)
Albumin: 4.6 g/dL (ref 3.8–4.9)
Alkaline Phosphatase: 95 [IU]/L (ref 44–121)
BUN/Creatinine Ratio: 18 (ref 9–20)
BUN: 15 mg/dL (ref 6–24)
Bilirubin Total: 0.5 mg/dL (ref 0.0–1.2)
CO2: 25 mmol/L (ref 20–29)
Calcium: 9.6 mg/dL (ref 8.7–10.2)
Chloride: 101 mmol/L (ref 96–106)
Creatinine, Ser: 0.85 mg/dL (ref 0.76–1.27)
Globulin, Total: 2.5 g/dL (ref 1.5–4.5)
Glucose: 90 mg/dL (ref 70–99)
Potassium: 4.3 mmol/L (ref 3.5–5.2)
Sodium: 140 mmol/L (ref 134–144)
Total Protein: 7.1 g/dL (ref 6.0–8.5)
eGFR: 105 mL/min/{1.73_m2} (ref >59)

## 2023-08-08 LAB — LIPID PANEL
Chol/HDL Ratio: 2.7 {ratio} (ref 0.0–5.0)
Cholesterol, Total: 179 mg/dL (ref 100–199)
HDL: 66 mg/dL (ref >39)
LDL Chol Calc (NIH): 100 mg/dL — ABNORMAL HIGH (ref 0–99)
Triglycerides: 69 mg/dL (ref 0–149)
VLDL Cholesterol Cal: 13 mg/dL (ref 5–40)

## 2023-08-08 LAB — TSH: TSH: 2.24 u[IU]/mL (ref 0.450–4.500)

## 2023-08-08 LAB — HEMOGLOBIN A1C
Est. average glucose Bld gHb Est-mCnc: 111 mg/dL
Hgb A1c MFr Bld: 5.5 % (ref 4.8–5.6)

## 2023-08-21 ENCOUNTER — Encounter (INDEPENDENT_AMBULATORY_CARE_PROVIDER_SITE_OTHER): Payer: Self-pay | Admitting: Sports Medicine

## 2023-08-21 DIAGNOSIS — F419 Anxiety disorder, unspecified: Secondary | ICD-10-CM

## 2023-08-21 MED ORDER — HYDROXYZINE HCL 25 MG PO TABS
25.0000 mg | ORAL_TABLET | Freq: Three times a day (TID) | ORAL | 3 refills | Status: DC | PRN
Start: 2023-08-21 — End: 2024-08-07

## 2023-08-21 NOTE — Telephone Encounter (Signed)

## 2023-08-21 NOTE — Assessment & Plan Note (Signed)
Situational anxiety regarding upcoming divorce, requesting anxiolytic, adding hydroxyzine, can consider CBT and benzo if ineffective.

## 2023-08-29 LAB — COLOGUARD: COLOGUARD: NEGATIVE

## 2023-09-18 ENCOUNTER — Encounter: Payer: Self-pay | Admitting: Sports Medicine

## 2023-10-23 ENCOUNTER — Encounter: Payer: Self-pay | Admitting: Sports Medicine

## 2023-10-23 DIAGNOSIS — I10 Essential (primary) hypertension: Secondary | ICD-10-CM

## 2023-10-23 MED ORDER — AMLODIPINE BESYLATE 5 MG PO TABS: 5.0000 mg | ORAL_TABLET | Freq: Every day | ORAL | 3 refills | Status: DC

## 2023-11-08 DIAGNOSIS — M5416 Radiculopathy, lumbar region: Secondary | ICD-10-CM | POA: Diagnosis not present

## 2023-11-09 ENCOUNTER — Encounter: Payer: Self-pay | Admitting: Sports Medicine

## 2023-11-13 ENCOUNTER — Encounter: Payer: Self-pay | Admitting: Sports Medicine

## 2023-11-13 ENCOUNTER — Ambulatory Visit (INDEPENDENT_AMBULATORY_CARE_PROVIDER_SITE_OTHER): Payer: Self-pay | Admitting: Sports Medicine

## 2023-11-13 ENCOUNTER — Ambulatory Visit: Payer: BC Managed Care – PPO

## 2023-11-13 DIAGNOSIS — R202 Paresthesia of skin: Secondary | ICD-10-CM

## 2023-11-13 DIAGNOSIS — R200 Anesthesia of skin: Secondary | ICD-10-CM | POA: Diagnosis not present

## 2023-11-13 DIAGNOSIS — M47812 Spondylosis without myelopathy or radiculopathy, cervical region: Secondary | ICD-10-CM | POA: Diagnosis not present

## 2023-11-13 MED ORDER — PREDNISONE 50 MG PO TABS
ORAL_TABLET | ORAL | 0 refills | Status: DC
Start: 2023-11-13 — End: 2024-08-07

## 2023-11-13 NOTE — Progress Notes (Signed)
    Procedures performed today:    None.  Independent interpretation of notes and tests performed by another provider:   None.  Brief History, Exam, Impression, and Recommendations:    Numbness and tingling in left hand Very pleasant 53 year old male, for the past week he has had achiness and throbbing left hand worse at night and in the evenings, now getting occasional numbness and tingling third and fourth fingers. Pain radiates to the palm and wrist but not proximal. Negative Phalen sign, positive Tinel's sign reproductive of symptoms, negative Spurling's test. Differential is broad and includes cervical radiculopathy and carpal tunnel syndrome, we will treat as both though I do think we are dealing with carpal tunnel. Adding nocturnal splinting, home PT for the neck and carpal tunnel, 5 days of prednisone , x-rays of the neck, return in 6 weeks. If symptoms declare themselves as cervical we will proceed with MRI, if they declare themselves as carpal tunnel we will proceed with median nerve hydrodissection, if unsure we can proceed with nerve conduction and EMG, patient understands the theory here.    ____________________________________________ Debby PARAS. Curtis, M.D., ABFM., CAQSM., AME. Primary Care and Sports Medicine [facility]  Adjunct Professor of [facility] Medicine  [employer]  Restaurant Manager, [employer]

## 2023-11-13 NOTE — Assessment & Plan Note (Signed)
 Very pleasant 53 year old male, for the past week he has had achiness and throbbing left hand worse at night and in the evenings, now getting occasional numbness and tingling third and fourth fingers. Pain radiates to the palm and wrist but not proximal. Negative Phalen sign, positive Tinel's sign reproductive of symptoms, negative Spurling's test. Differential is broad and includes cervical radiculopathy and carpal tunnel syndrome, we will treat as both though I do think we are dealing with carpal tunnel. Adding nocturnal splinting, home PT for the neck and carpal tunnel, 5 days of prednisone , x-rays of the neck, return in 6 weeks. If symptoms declare themselves as cervical we will proceed with MRI, if they declare themselves as carpal tunnel we will proceed with median nerve hydrodissection, if unsure we can proceed with nerve conduction and EMG, patient understands the theory here.

## 2023-11-13 NOTE — Patient Instructions (Signed)
 Marland Kitchen

## 2023-11-22 DIAGNOSIS — M5416 Radiculopathy, lumbar region: Secondary | ICD-10-CM | POA: Diagnosis not present

## 2023-11-22 DIAGNOSIS — Z6828 Body mass index (BMI) 28.0-28.9, adult: Secondary | ICD-10-CM | POA: Diagnosis not present

## 2023-12-04 DIAGNOSIS — M5416 Radiculopathy, lumbar region: Secondary | ICD-10-CM | POA: Diagnosis not present

## 2023-12-25 ENCOUNTER — Ambulatory Visit: Payer: BC Managed Care – PPO | Admitting: Sports Medicine

## 2023-12-30 IMAGING — DX DG LUMBAR SPINE COMPLETE 4+V
5 series · 5 of 5 positions shown · non-contrast
Comparison: None.

CLINICAL DATA: Left lumbar radiculitis. Low back and left calf pain
with no injury.

EXAM:
LUMBAR SPINE - COMPLETE 4+ VIEW

[l-spine ap]
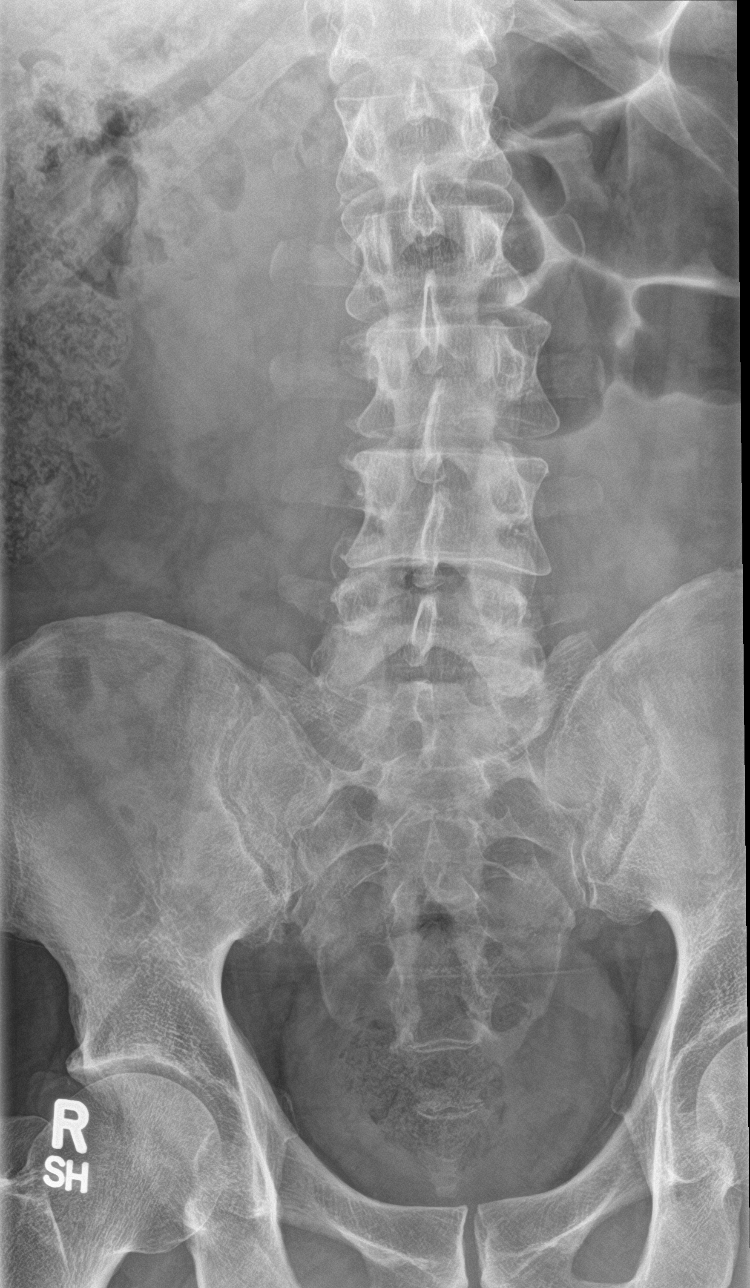

[l-spine obl (1 of 2)]
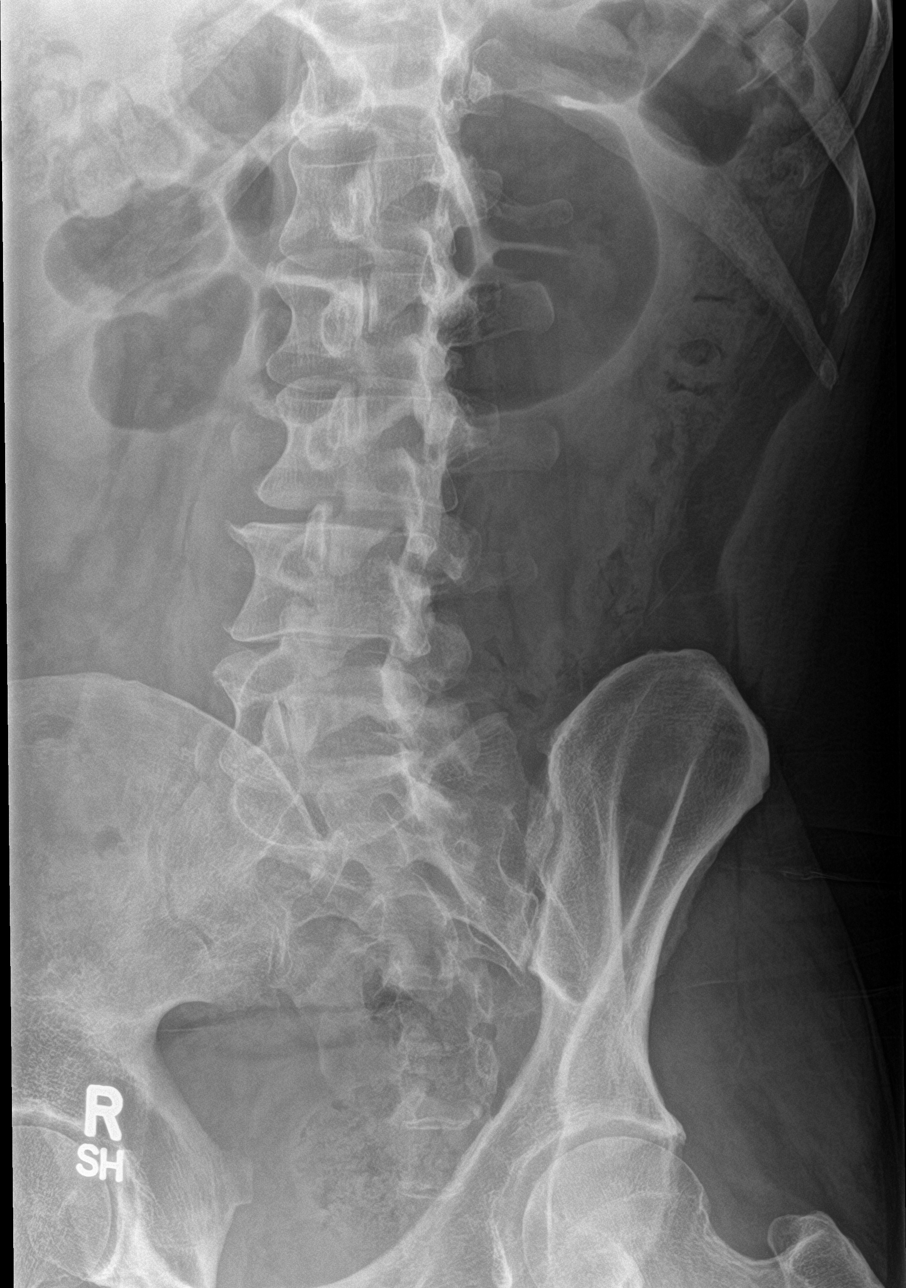

[l-spine obl (2 of 2)]
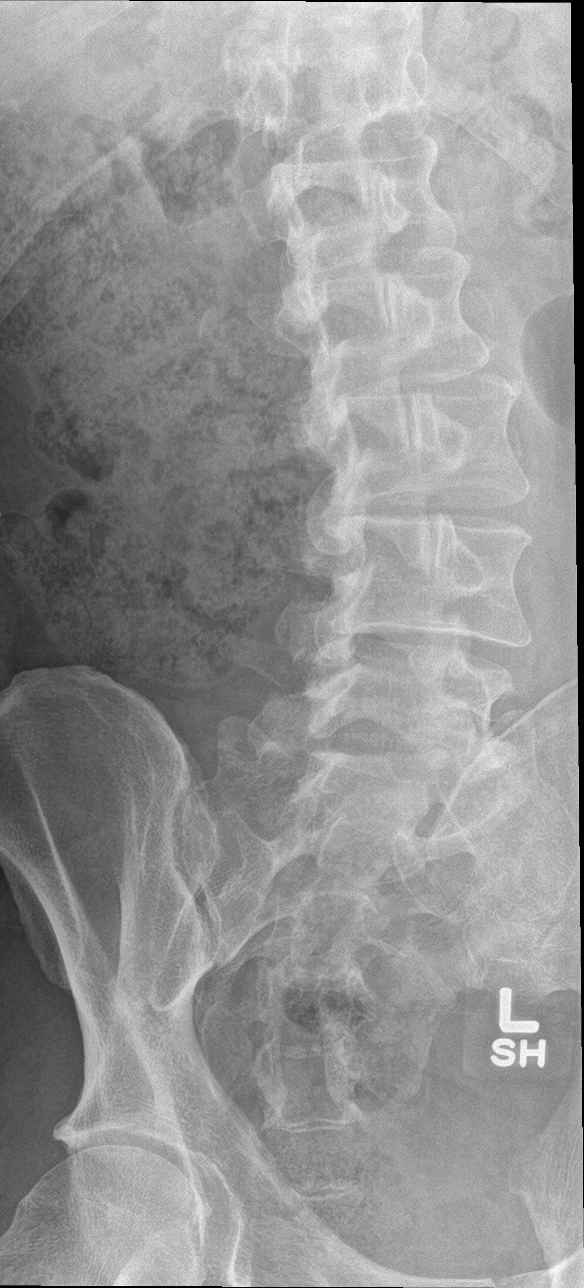

[l-spine lat]
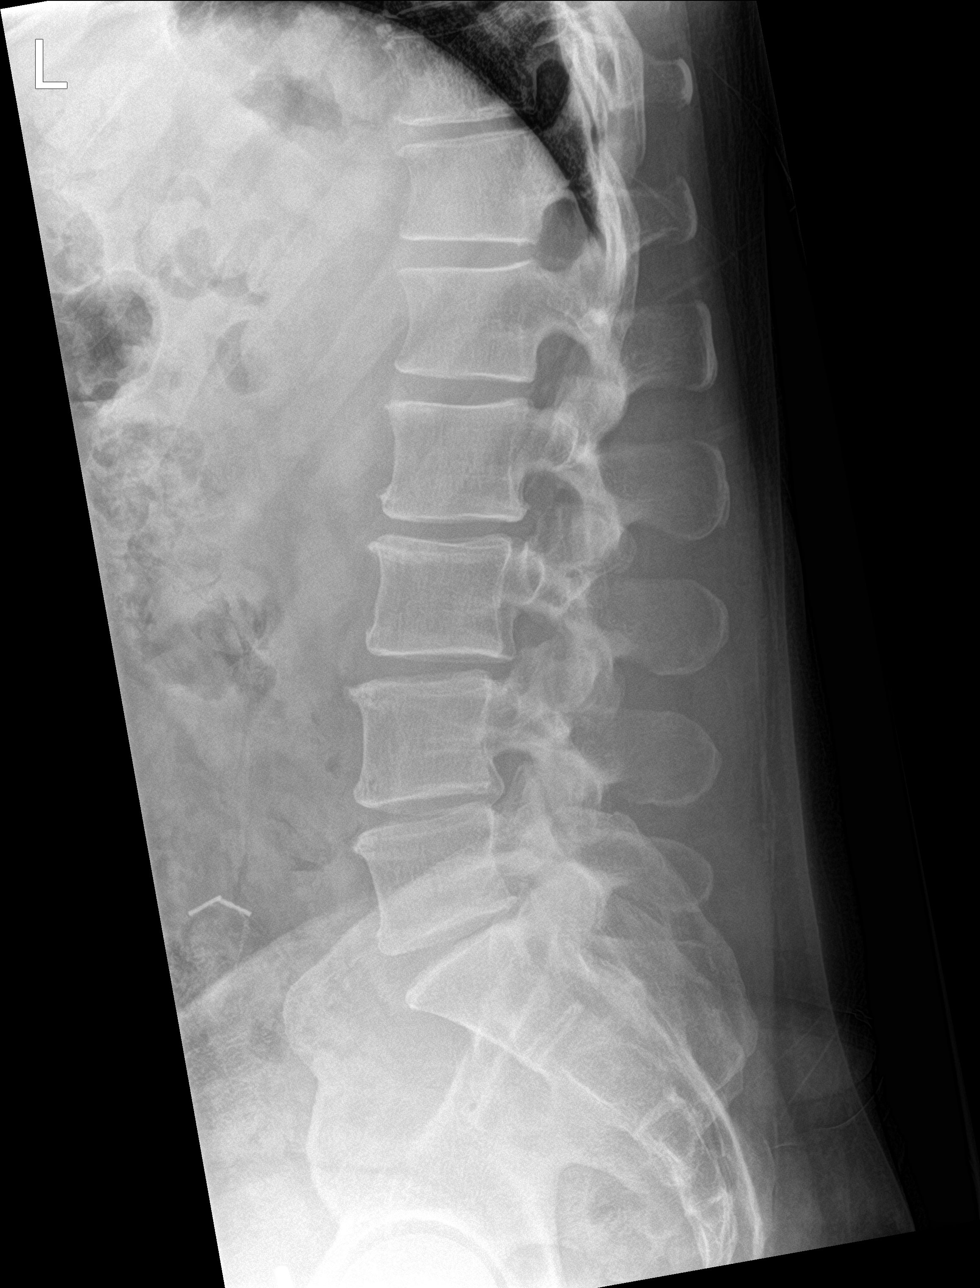

[l-spine spot]
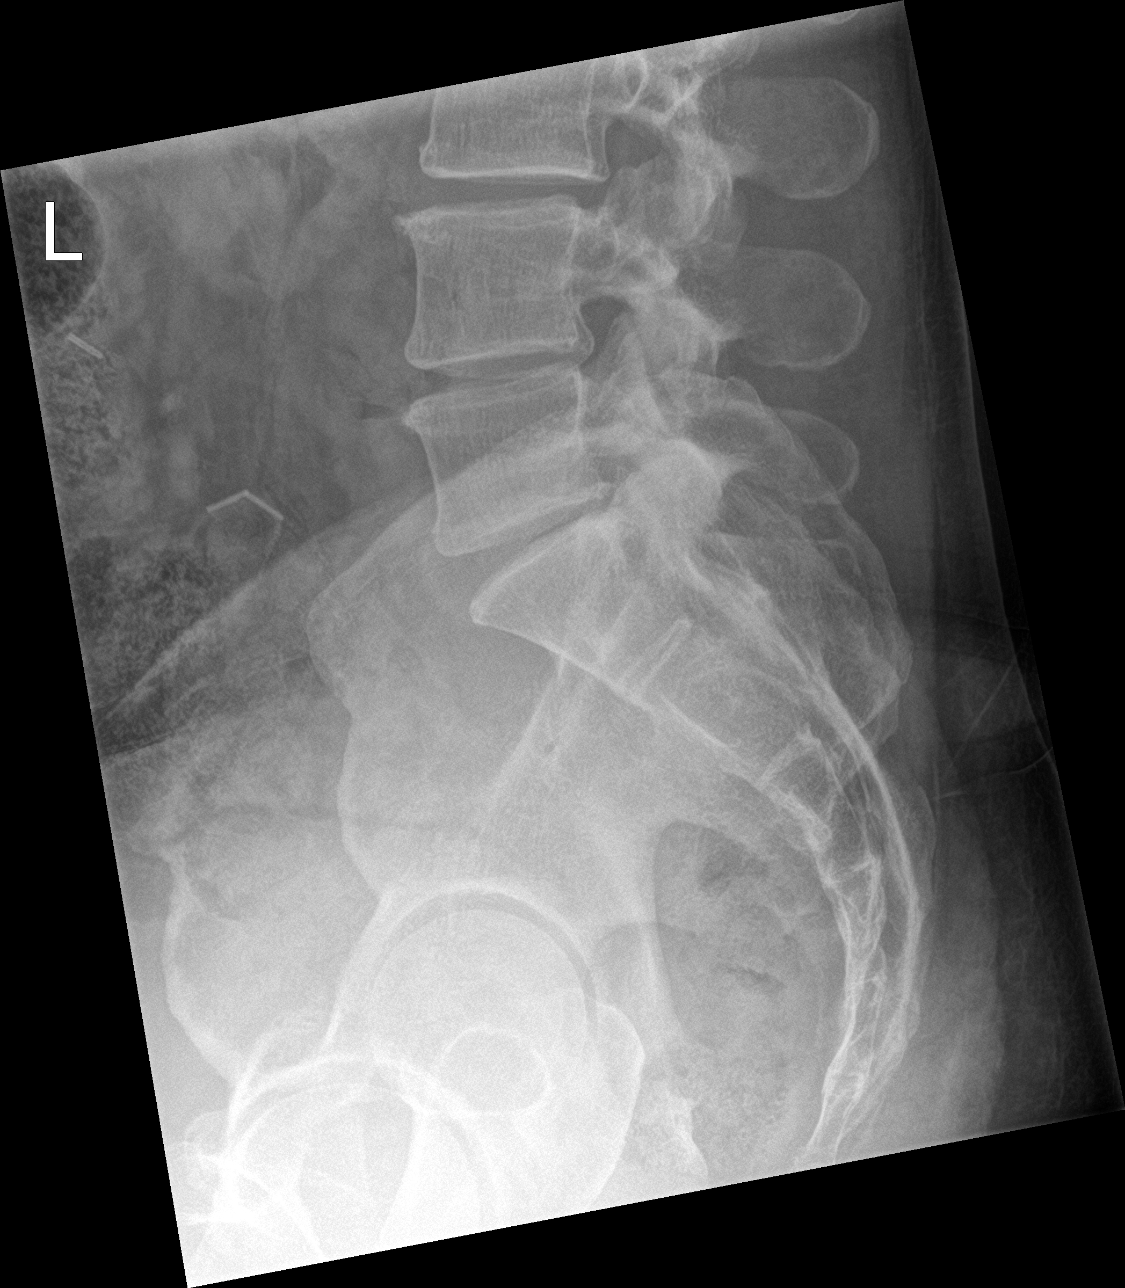

[5 of 5 positions shown; findings below may reference images not displayed]

FINDINGS: There are 5 lumbar type vertebra. Mild broad-based levo scoliotic
curvature centered at L3-L4. Trace retrolisthesis of L3 on L4. Mild
diffuse disc space narrowing and anterior spurring. Mild L4-L5 and
L5-S1 facet hypertrophy. Normal vertebral body heights. No evidence
of fracture, focal bone lesion or bone destruction. Sacroiliac
joints are congruent. No pars defects.
IMPRESSION: 1. Mild diffuse degenerative disc disease.
2. Mild levo scoliotic curvature.  Trace retrolisthesis of L3 on L4.
3. Mild L4-L5 and L5-S1 facet hypertrophy.

## 2024-01-27 ENCOUNTER — Other Ambulatory Visit: Payer: Self-pay | Admitting: Sports Medicine

## 2024-01-27 DIAGNOSIS — I10 Essential (primary) hypertension: Secondary | ICD-10-CM

## 2024-02-21 DIAGNOSIS — M9903 Segmental and somatic dysfunction of lumbar region: Secondary | ICD-10-CM | POA: Diagnosis not present

## 2024-02-21 DIAGNOSIS — M542 Cervicalgia: Secondary | ICD-10-CM | POA: Diagnosis not present

## 2024-02-21 DIAGNOSIS — M9901 Segmental and somatic dysfunction of cervical region: Secondary | ICD-10-CM | POA: Diagnosis not present

## 2024-02-25 DIAGNOSIS — M9901 Segmental and somatic dysfunction of cervical region: Secondary | ICD-10-CM | POA: Diagnosis not present

## 2024-02-25 DIAGNOSIS — M9903 Segmental and somatic dysfunction of lumbar region: Secondary | ICD-10-CM | POA: Diagnosis not present

## 2024-02-25 DIAGNOSIS — M542 Cervicalgia: Secondary | ICD-10-CM | POA: Diagnosis not present

## 2024-02-27 DIAGNOSIS — M9901 Segmental and somatic dysfunction of cervical region: Secondary | ICD-10-CM | POA: Diagnosis not present

## 2024-02-27 DIAGNOSIS — M9903 Segmental and somatic dysfunction of lumbar region: Secondary | ICD-10-CM | POA: Diagnosis not present

## 2024-02-27 DIAGNOSIS — M542 Cervicalgia: Secondary | ICD-10-CM | POA: Diagnosis not present

## 2024-03-03 DIAGNOSIS — M542 Cervicalgia: Secondary | ICD-10-CM | POA: Diagnosis not present

## 2024-03-03 DIAGNOSIS — M9903 Segmental and somatic dysfunction of lumbar region: Secondary | ICD-10-CM | POA: Diagnosis not present

## 2024-03-03 DIAGNOSIS — M9901 Segmental and somatic dysfunction of cervical region: Secondary | ICD-10-CM | POA: Diagnosis not present

## 2024-03-05 DIAGNOSIS — M542 Cervicalgia: Secondary | ICD-10-CM | POA: Diagnosis not present

## 2024-03-05 DIAGNOSIS — M9903 Segmental and somatic dysfunction of lumbar region: Secondary | ICD-10-CM | POA: Diagnosis not present

## 2024-03-05 DIAGNOSIS — M9901 Segmental and somatic dysfunction of cervical region: Secondary | ICD-10-CM | POA: Diagnosis not present

## 2024-03-05 DIAGNOSIS — M51372 Other intervertebral disc degeneration, lumbosacral region with discogenic back pain and lower extremity pain: Secondary | ICD-10-CM | POA: Diagnosis not present

## 2024-03-07 DIAGNOSIS — M51372 Other intervertebral disc degeneration, lumbosacral region with discogenic back pain and lower extremity pain: Secondary | ICD-10-CM | POA: Diagnosis not present

## 2024-03-07 DIAGNOSIS — M542 Cervicalgia: Secondary | ICD-10-CM | POA: Diagnosis not present

## 2024-03-07 DIAGNOSIS — M9903 Segmental and somatic dysfunction of lumbar region: Secondary | ICD-10-CM | POA: Diagnosis not present

## 2024-03-07 DIAGNOSIS — M9901 Segmental and somatic dysfunction of cervical region: Secondary | ICD-10-CM | POA: Diagnosis not present

## 2024-03-10 DIAGNOSIS — M51372 Other intervertebral disc degeneration, lumbosacral region with discogenic back pain and lower extremity pain: Secondary | ICD-10-CM | POA: Diagnosis not present

## 2024-03-10 DIAGNOSIS — M9903 Segmental and somatic dysfunction of lumbar region: Secondary | ICD-10-CM | POA: Diagnosis not present

## 2024-03-10 DIAGNOSIS — M542 Cervicalgia: Secondary | ICD-10-CM | POA: Diagnosis not present

## 2024-03-10 DIAGNOSIS — M9901 Segmental and somatic dysfunction of cervical region: Secondary | ICD-10-CM | POA: Diagnosis not present

## 2024-03-12 DIAGNOSIS — M9901 Segmental and somatic dysfunction of cervical region: Secondary | ICD-10-CM | POA: Diagnosis not present

## 2024-03-12 DIAGNOSIS — M9903 Segmental and somatic dysfunction of lumbar region: Secondary | ICD-10-CM | POA: Diagnosis not present

## 2024-03-12 DIAGNOSIS — M542 Cervicalgia: Secondary | ICD-10-CM | POA: Diagnosis not present

## 2024-03-12 DIAGNOSIS — M51372 Other intervertebral disc degeneration, lumbosacral region with discogenic back pain and lower extremity pain: Secondary | ICD-10-CM | POA: Diagnosis not present

## 2024-03-17 DIAGNOSIS — M9901 Segmental and somatic dysfunction of cervical region: Secondary | ICD-10-CM | POA: Diagnosis not present

## 2024-03-17 DIAGNOSIS — M9903 Segmental and somatic dysfunction of lumbar region: Secondary | ICD-10-CM | POA: Diagnosis not present

## 2024-03-17 DIAGNOSIS — M542 Cervicalgia: Secondary | ICD-10-CM | POA: Diagnosis not present

## 2024-03-17 DIAGNOSIS — M51372 Other intervertebral disc degeneration, lumbosacral region with discogenic back pain and lower extremity pain: Secondary | ICD-10-CM | POA: Diagnosis not present

## 2024-04-01 DIAGNOSIS — M51361 Other intervertebral disc degeneration, lumbar region with lower extremity pain only: Secondary | ICD-10-CM | POA: Diagnosis not present

## 2024-04-01 DIAGNOSIS — M48062 Spinal stenosis, lumbar region with neurogenic claudication: Secondary | ICD-10-CM | POA: Diagnosis not present

## 2024-04-01 DIAGNOSIS — Z9889 Other specified postprocedural states: Secondary | ICD-10-CM | POA: Diagnosis not present

## 2024-04-01 DIAGNOSIS — M5416 Radiculopathy, lumbar region: Secondary | ICD-10-CM | POA: Diagnosis not present

## 2024-06-25 DIAGNOSIS — M48062 Spinal stenosis, lumbar region with neurogenic claudication: Secondary | ICD-10-CM | POA: Diagnosis not present

## 2024-06-25 DIAGNOSIS — M5416 Radiculopathy, lumbar region: Secondary | ICD-10-CM | POA: Diagnosis not present

## 2024-06-25 DIAGNOSIS — Z9889 Other specified postprocedural states: Secondary | ICD-10-CM | POA: Diagnosis not present

## 2024-07-08 ENCOUNTER — Encounter: Payer: Self-pay | Admitting: Sports Medicine

## 2024-07-10 DIAGNOSIS — M5416 Radiculopathy, lumbar region: Secondary | ICD-10-CM | POA: Diagnosis not present

## 2024-07-10 DIAGNOSIS — Z9889 Other specified postprocedural states: Secondary | ICD-10-CM | POA: Diagnosis not present

## 2024-08-07 ENCOUNTER — Encounter: Payer: Self-pay | Admitting: Urgent Care

## 2024-08-07 ENCOUNTER — Telehealth: Payer: Self-pay

## 2024-08-07 ENCOUNTER — Ambulatory Visit (INDEPENDENT_AMBULATORY_CARE_PROVIDER_SITE_OTHER): Admitting: Urgent Care

## 2024-08-07 ENCOUNTER — Encounter: Admitting: Sports Medicine

## 2024-08-07 VITALS — BP 110/73 | HR 77 | Ht 71.0 in | Wt 200.0 lb

## 2024-08-07 DIAGNOSIS — I10 Essential (primary) hypertension: Secondary | ICD-10-CM | POA: Diagnosis not present

## 2024-08-07 DIAGNOSIS — E782 Mixed hyperlipidemia: Secondary | ICD-10-CM

## 2024-08-07 DIAGNOSIS — Z0000 Encounter for general adult medical examination without abnormal findings: Secondary | ICD-10-CM | POA: Diagnosis not present

## 2024-08-07 DIAGNOSIS — M48061 Spinal stenosis, lumbar region without neurogenic claudication: Secondary | ICD-10-CM

## 2024-08-07 DIAGNOSIS — N139 Obstructive and reflux uropathy, unspecified: Secondary | ICD-10-CM

## 2024-08-07 MED ORDER — DICLOFENAC EPOLAMINE 1.3 % EX PTCH: 1.0000 | MEDICATED_PATCH | Freq: Two times a day (BID) | CUTANEOUS | 6 refills | Status: DC

## 2024-08-07 MED ORDER — DICLOFENAC EPOLAMINE 1.3 % EX PTCH: 1.0000 | MEDICATED_PATCH | Freq: Two times a day (BID) | CUTANEOUS | 6 refills | Status: AC

## 2024-08-07 NOTE — Patient Instructions (Signed)
 Consider updating pneumonia and shingles vaccines.  Labs updated today. Return annually, sooner as needed.  Use the Flector patch to your back as needed; do not wear >12 hours consecutively.

## 2024-08-07 NOTE — Telephone Encounter (Signed)
 Copied from CRM #[id number]. Topic: Clinical - Prescription Issue >> Aug 07, 2024  9:58 AM Nathanel BROCKS wrote: Reason for CRM: Hailey from [facility] called and said that she needs to have the rx sent back over for a qty of 15. This is stock size. They dont break boxes. diclofenac (FLECTOR) 1.3 % PTCH please call if you have any questions.

## 2024-08-07 NOTE — Progress Notes (Unsigned)
 Complete physical exam  Patient: Jacob Sutton   DOB: April 23, 1971   53 y.o. Male  MRN: [id number]  Subjective:    Chief Complaint  Patient presents with   Annual Exam    fasting    Jacob Sutton is a 53 y.o. male who presents today for a complete physical exam. He reports consuming a general diet. The patient has a physically strenuous job, but has no regular exercise apart from work.  He generally feels fairly well. He reports sleeping well. He does not have additional problems to discuss today.   Discussed the use of AI scribe software for clinical note transcription with the patient, who gave verbal consent to proceed.  History of Present Illness   Jacob Sutton is a 53 year old male who presents for an annual physical exam.  He has a history of back issues and has undergone back surgery. He experiences a dull ache in the lower back, with occasional numbness in the front of the right leg and pain on the outside of the left leg. Prior to surgery, he had significant issues with his right leg, including zero reflexes and dragging, which have improved post-surgery. He occasionally takes ibuprofen for pain management, although it does not fully alleviate his symptoms. He has had injections that have helped with symptoms. He follows up with a specialist at [facility] for his back issues and has had multiple opinions, with both doctors recommending that fusion surgery may be needed in the future.  His physical activity has decreased since his back surgery. He engages in a lot of walking, primarily at work, and is active outdoors. He used to work out in [employer] before COVID-19 and his back issues.  He describes his diet as including meat, potatoes, salads, and some vegetables, but he acknowledges not eating enough fruit. He consumes dairy and is not vegetarian or vegan.  He has a history of high LDL cholesterol, with a previous reading of 100. He is not currently on any prescription medications, having  stopped amlodipine  and atorvastatin . He occasionally uses ibuprofen for back pain.  No significant sleep problems, although he occasionally wakes up at 4 AM to urinate, a change that started in the last year.       Most recent fall risk assessment:    06/15/2023    8:36 AM  Fall Risk   Falls in the past year? 0  Number falls in past yr: 0  Injury with Fall? 0  Risk for fall due to : No Fall Risks  Follow up Falls evaluation completed     Most recent depression screenings:    08/07/2024    8:29 AM 08/07/2023    8:58 AM  PHQ 2/9 Scores  PHQ - 2 Score 0 0    Vision:Within last year and Dental: No current dental problems and Receives regular dental care  Patient Active Problem List   Diagnosis Date Noted   Numbness and tingling in left hand 11/13/2023   Anxiety 08/21/2023   Benign essential hypertension 06/15/2023   Left lumbar radiculitis 01/06/2022   Pelvic pain 01/13/2021   Adult ADHD 08/14/2019   Hyperlipidemia, mixed 07/02/2018   Hearing loss 05/18/2016   Lateral epicondylitis of right elbow 05/18/2016   Annual physical exam 04/04/2013   Past Medical History:  Diagnosis Date   Benign essential hypertension 06/15/2023   Past Surgical History:  Procedure Laterality Date   APPENDECTOMY     CARDIAC CATHETERIZATION  11/06/12  Clean.   HERNIA REPAIR     VASECTOMY     Social History   Tobacco Use   Smoking status: Never   Smokeless tobacco: Never  Substance Use Topics   Alcohol use: Yes    Alcohol/week: 6.0 standard drinks of alcohol    Types: 6 Cans of beer per week   Drug use: No      Patient Care Team: Lowella Benton CROME, [place] as PCP - General (Physician Assistant)   Outpatient Medications Prior to Visit  Medication Sig   [DISCONTINUED] amLODipine  (NORVASC ) 5 MG tablet Take 1 tablet (5 mg total) by mouth daily. (Patient not taking: Reported on 08/07/2024)   [DISCONTINUED] atorvastatin  (LIPITOR) 10 MG tablet Take 1 tablet (10 mg total) by mouth daily.    [DISCONTINUED] hydrOXYzine  (ATARAX ) 25 MG tablet Take 1 tablet (25 mg total) by mouth 3 (three) times daily as needed for anxiety.   [DISCONTINUED] predniSONE  (DELTASONE ) 50 MG tablet One tab PO daily for 5 days.   No facility-administered medications prior to visit.    ROS Complete 12 point ROS performed with all pertinent positives listed in HPI     Objective:     BP 110/73   Pulse 77   Ht 5' 11 (1.803 m)   Wt 200 lb (90.7 kg)   SpO2 99%   BMI 27.89 kg/m  BP Readings from Last 3 Encounters:  08/07/24 110/73  09/18/23 122/82  07/13/23 134/85   Wt Readings from Last 3 Encounters:  08/07/24 200 lb (90.7 kg)  08/07/23 206 lb (93.4 kg)  06/15/23 205 lb (93 kg)      Physical Exam Vitals and nursing note reviewed.  Constitutional:      General: He is not in acute distress.    Appearance: Normal appearance. He is not ill-appearing, toxic-appearing or diaphoretic.  HENT:     Head: Normocephalic and atraumatic.     Right Ear: Ear canal and external ear normal. There is no impacted cerumen. Tympanic membrane is scarred.     Left Ear: Ear canal and external ear normal. There is no impacted cerumen. Tympanic membrane is scarred.     Ears:     Comments: B hearing aids    Nose: Nose normal.     Mouth/Throat:     Mouth: Mucous membranes are moist.     Pharynx: Oropharynx is clear. No oropharyngeal exudate or posterior oropharyngeal erythema.  Eyes:     General: No scleral icterus.       Right eye: No discharge.        Left eye: No discharge.     Extraocular Movements: Extraocular movements intact.     Pupils: Pupils are equal, round, and reactive to light.  Neck:     Thyroid : No thyroid  mass, thyromegaly or thyroid  tenderness.  Cardiovascular:     Rate and Rhythm: Normal rate and regular rhythm.     Pulses: Normal pulses.     Heart sounds: No murmur heard. Pulmonary:     Effort: Pulmonary effort is normal. No respiratory distress.     Breath sounds: Normal breath  sounds. No stridor. No wheezing or rhonchi.  Abdominal:     General: Abdomen is flat. Bowel sounds are normal. There is no distension.     Palpations: Abdomen is soft. There is no mass.     Tenderness: There is no abdominal tenderness. There is no guarding.  Musculoskeletal:     Cervical back: Normal range of motion and neck supple. No  rigidity or tenderness.     Right lower leg: No edema.     Left lower leg: No edema.  Lymphadenopathy:     Cervical: No cervical adenopathy.  Skin:    General: Skin is warm and dry.     Coloration: Skin is not jaundiced.     Findings: No bruising, erythema or rash.  Neurological:     General: No focal deficit present.     Mental Status: He is alert and oriented to person, place, and time.     Sensory: No sensory deficit.     Motor: No weakness.  Psychiatric:        Mood and Affect: Mood normal.        Behavior: Behavior normal.      No results found for any visits on 08/07/24. Last CBC Lab Results  Component Value Date   WBC 7.3 08/07/2023   HGB 16.8 08/07/2023   HCT 50.2 08/07/2023   MCV 90 08/07/2023   MCH 30.2 08/07/2023   RDW 12.5 08/07/2023   PLT 248 08/07/2023   Last metabolic panel Lab Results  Component Value Date   GLUCOSE 90 08/07/2023   NA 140 08/07/2023   K 4.3 08/07/2023   CL 101 08/07/2023   CO2 25 08/07/2023   BUN 15 08/07/2023   CREATININE 0.85 08/07/2023   EGFR 105 08/07/2023   CALCIUM  9.6 08/07/2023   PROT 7.1 08/07/2023   ALBUMIN 4.6 08/07/2023   LABGLOB 2.5 08/07/2023   BILITOT 0.5 08/07/2023   ALKPHOS 95 08/07/2023   AST 18 08/07/2023   ALT 19 08/07/2023   Last lipids Lab Results  Component Value Date   CHOL 179 08/07/2023   HDL 66 08/07/2023   LDLCALC 100 (H) 08/07/2023   TRIG 69 08/07/2023   CHOLHDL 2.7 08/07/2023   Last hemoglobin A1c Lab Results  Component Value Date   HGBA1C 5.5 08/07/2023   Last thyroid  functions Lab Results  Component Value Date   TSH 2.240 08/07/2023   Last  vitamin D  Lab Results  Component Value Date   VD25OH 37 08/14/2019   Last vitamin B12 and Folate No results found for: VITAMINB12, FOLATE      Assessment & Plan:    Routine Health Maintenance and Physical Exam  Immunization History  Administered Date(s) Administered   PFIZER(Purple Top)SARS-COV-2 Vaccination 08/06/2020, 09/06/2020   Tdap 05/18/2015    Health Maintenance  Topic Date Due   Hepatitis B Vaccines 19-59 Average Risk (1 of 3 - 19+ 3-dose series) Never done   COVID-19 Vaccine (3 - 2025-26 season) 07/07/2024   Zoster Vaccines- Shingrix (1 of 2) 11/08/2024 (Originally 03/12/2021)   Influenza Vaccine  02/03/2025 (Originally 06/06/2024)   Pneumococcal Vaccine: 50+ Years (1 of 1 - PCV) 08/08/2025 (Originally 03/12/2021)   DTaP/Tdap/Td (2 - Td or Tdap) 05/17/2025   Fecal DNA (Cologuard)  08/22/2026   HIV Screening  Completed   HPV VACCINES  Aged Out   Meningococcal B Vaccine  Aged Out   Hepatitis C Screening  Discontinued    Discussed health benefits of physical activity, and encouraged him to engage in regular exercise appropriate for his age and condition.  Problem List Items Addressed This Visit     Annual physical exam - Primary   Relevant Orders   CBC with Differential/Platelet   Hemoglobin A1c   TSH   Lipid panel   Comprehensive metabolic panel with GFR   PSA   Hyperlipidemia, mixed   Relevant Orders   Lipid panel  Benign essential hypertension   Relevant Orders   CBC with Differential/Platelet   TSH   Comprehensive metabolic panel with GFR   Other Visit Diagnoses       Obstructive uropathy       Relevant Orders   PSA     Spinal stenosis of lumbar region, unspecified whether neurogenic claudication present          Return in about 1 year (around 08/07/2025) for Annual Physical.  Assessment and Plan    Adult Wellness Visit Annual wellness visit completed. Discussed dietary habits, physical activity, and sleep. Hearing aids used due to  occupational hearing loss. Previous labs normal except for slightly elevated LDL. Vaccinations reviewed, flu vaccine planned, pneumonia and zoster vaccines declined. Explained zoster vaccine benefits before age 51. - All vaccines refused. - Order fasting labs. - Encourage consideration of zoster vaccine before age 13. - Continue routine dental and eye care.  Chronic low back pain with history of back surgery Chronic low back pain persists post-surgery. Ibuprofen partially effective. Discussed Flector patch as a topical alternative to reduce gastrointestinal and renal risks. - Prescribe Flector patch if covered by insurance. - Advise use of Flector patch on an as-needed basis, ensuring a 12-hour period free between applications. - Continue follow-up with back specialist at [facility].          Benton LITTIE Gave, PA

## 2024-08-08 ENCOUNTER — Ambulatory Visit: Payer: Self-pay | Admitting: Urgent Care

## 2024-08-08 LAB — LIPID PANEL
Chol/HDL Ratio: 3.6 ratio (ref 0.0–5.0)
Cholesterol, Total: 197 mg/dL (ref 100–199)
HDL: 55 mg/dL (ref >39)
LDL Chol Calc (NIH): 125 mg/dL — ABNORMAL HIGH (ref 0–99)
Triglycerides: 97 mg/dL (ref 0–149)
VLDL Cholesterol Cal: 17 mg/dL (ref 5–40)

## 2024-08-08 LAB — CBC WITH DIFFERENTIAL/PLATELET
Basophils Absolute: 0.1 x10E3/uL (ref 0.0–0.2)
Basos: 1 %
EOS (ABSOLUTE): 0.1 x10E3/uL (ref 0.0–0.4)
Eos: 3 %
Hematocrit: 45.7 % (ref 37.5–51.0)
Hemoglobin: 15.4 g/dL (ref 13.0–17.7)
Immature Grans (Abs): 0 x10E3/uL (ref 0.0–0.1)
Immature Granulocytes: 0 %
Lymphocytes Absolute: 1.3 x10E3/uL (ref 0.7–3.1)
Lymphs: 26 %
MCH: 30.7 pg (ref 26.6–33.0)
MCHC: 33.7 g/dL (ref 31.5–35.7)
MCV: 91 fL (ref 79–97)
Monocytes Absolute: 0.5 x10E3/uL (ref 0.1–0.9)
Monocytes: 10 %
Neutrophils Absolute: 3.1 x10E3/uL (ref 1.4–7.0)
Neutrophils: 60 %
Platelets: 253 x10E3/uL (ref 150–450)
RBC: 5.02 x10E6/uL (ref 4.14–5.80)
RDW: 13.3 % (ref 11.6–15.4)
WBC: 5.2 x10E3/uL (ref 3.4–10.8)

## 2024-08-08 LAB — COMPREHENSIVE METABOLIC PANEL WITH GFR
ALT: 14 IU/L (ref 0–44)
AST: 14 IU/L (ref 0–40)
Albumin: 4.7 g/dL (ref 3.8–4.9)
Alkaline Phosphatase: 80 IU/L (ref 47–123)
BUN/Creatinine Ratio: 21 — ABNORMAL HIGH (ref 9–20)
BUN: 21 mg/dL (ref 6–24)
Bilirubin Total: 0.3 mg/dL (ref 0.0–1.2)
CO2: 23 mmol/L (ref 20–29)
Calcium: 9.6 mg/dL (ref 8.7–10.2)
Chloride: 102 mmol/L (ref 96–106)
Creatinine, Ser: 1 mg/dL (ref 0.76–1.27)
Globulin, Total: 2.5 g/dL (ref 1.5–4.5)
Glucose: 91 mg/dL (ref 70–99)
Potassium: 4.9 mmol/L (ref 3.5–5.2)
Sodium: 139 mmol/L (ref 134–144)
Total Protein: 7.2 g/dL (ref 6.0–8.5)
eGFR: 90 mL/min/1.73 (ref >59)

## 2024-08-08 LAB — PSA: Prostate Specific Ag, Serum: 1.5 ng/mL (ref 0.0–4.0)

## 2024-08-08 LAB — HEMOGLOBIN A1C
Est. average glucose Bld gHb Est-mCnc: 108 mg/dL
Hgb A1c MFr Bld: 5.4 % (ref 4.8–5.6)

## 2024-08-08 LAB — TSH: TSH: 1.76 u[IU]/mL (ref 0.450–4.500)

## 2024-10-08 ENCOUNTER — Encounter: Payer: Self-pay | Admitting: Urgent Care

## 2024-10-08 DIAGNOSIS — M48061 Spinal stenosis, lumbar region without neurogenic claudication: Secondary | ICD-10-CM

## 2024-10-08 MED ORDER — BACLOFEN 10 MG PO TABS: 10.0000 mg | ORAL_TABLET | Freq: Three times a day (TID) | ORAL | 6 refills | Status: AC

## 2024-11-17 ENCOUNTER — Encounter: Payer: Self-pay | Admitting: Urgent Care

## 2024-11-17 DIAGNOSIS — R0683 Snoring: Secondary | ICD-10-CM

## 2024-11-17 DIAGNOSIS — R0681 Apnea, not elsewhere classified: Secondary | ICD-10-CM

## 2024-11-17 NOTE — Telephone Encounter (Signed)

## 2025-08-10 ENCOUNTER — Encounter: Admitting: Urgent Care
